# Patient Record
Sex: Female | Born: 1990 | Race: White | Hispanic: No | Marital: Married | Smoking: Never smoker
Health system: Southern US, Community
[De-identification: ages and names within clinical notes are randomized; demographics above are authoritative.]

## PROBLEM LIST (undated history)

## (undated) DIAGNOSIS — Z789 Other specified health status: Secondary | ICD-10-CM

## (undated) HISTORY — PX: NO PAST SURGERIES: SHX2092

## (undated) HISTORY — PX: WISDOM TOOTH EXTRACTION: SHX21

---

## 2016-04-23 NOTE — L&D Delivery Note (Signed)
Delivery Note Pt pushed well about 20 minutes with and at 1:52 AM a healthy female was delivered via Vaginal, Spontaneous Delivery (Presentation: LOA  ).  APGAR: 8, 9; weight pending .   Placenta status: spontaneous .  Cord:  with the following complications: nuchal.    Anesthesia:  Local lidocaine block for repair Episiotomy: None Lacerations: 2nd degree;Perineal Suture Repair: 3.0 vicryl rapide Est. Blood Loss (mL):   Mom to postpartum.  Baby to Couplet care / Skin to Skin.  Marilyn Guerra 01/16/2017, 2:22 AM

## 2016-08-16 LAB — OB RESULTS CONSOLE HIV ANTIBODY (ROUTINE TESTING): HIV: NONREACTIVE

## 2016-08-16 LAB — OB RESULTS CONSOLE RUBELLA ANTIBODY, IGM: Rubella: IMMUNE

## 2016-08-16 LAB — OB RESULTS CONSOLE ABO/RH: RH TYPE: POSITIVE

## 2016-08-16 LAB — OB RESULTS CONSOLE RPR: RPR: NONREACTIVE

## 2016-08-16 LAB — OB RESULTS CONSOLE GC/CHLAMYDIA
Chlamydia: NEGATIVE
GC PROBE AMP, GENITAL: NEGATIVE

## 2016-08-16 LAB — OB RESULTS CONSOLE ANTIBODY SCREEN: ANTIBODY SCREEN: NEGATIVE

## 2016-08-16 LAB — OB RESULTS CONSOLE HEPATITIS B SURFACE ANTIGEN: Hepatitis B Surface Ag: NEGATIVE

## 2017-01-01 LAB — OB RESULTS CONSOLE GBS: STREP GROUP B AG: NEGATIVE

## 2017-01-16 ENCOUNTER — Encounter (HOSPITAL_COMMUNITY): Payer: Self-pay

## 2017-01-16 ENCOUNTER — Inpatient Hospital Stay (HOSPITAL_COMMUNITY)
Admission: AD | Admit: 2017-01-16 | Discharge: 2017-01-17 | DRG: 775 | Disposition: A | Discharge status: Home / Self Care | Payer: Commercial Managed Care - PPO | Source: Ambulatory Visit | Attending: Obstetrics and Gynecology | Admitting: Obstetrics and Gynecology

## 2017-01-16 DIAGNOSIS — Z3A38 38 weeks gestation of pregnancy: Secondary | ICD-10-CM

## 2017-01-16 DIAGNOSIS — O26893 Other specified pregnancy related conditions, third trimester: Secondary | ICD-10-CM | POA: Diagnosis present

## 2017-01-16 HISTORY — DX: Other specified health status: Z78.9

## 2017-01-16 LAB — CBC
HCT: 38.3 % (ref 36.0–46.0)
HCT: 34.6 % — ABNORMAL LOW (ref 36.0–46.0)
HEMOGLOBIN: 12.1 g/dL (ref 12.0–15.0)
Hemoglobin: 13.6 g/dL (ref 12.0–15.0)
MCH: 30.8 pg (ref 26.0–34.0)
MCH: 30.8 pg (ref 26.0–34.0)
MCHC: 35.5 g/dL (ref 30.0–36.0)
MCHC: 35 g/dL (ref 30.0–36.0)
MCV: 86.8 fL (ref 78.0–100.0)
MCV: 88 fL (ref 78.0–100.0)
PLATELETS: 234 10*3/uL (ref 150–400)
Platelets: 250 10*3/uL (ref 150–400)
RBC: 4.41 MIL/uL (ref 3.87–5.11)
RBC: 3.93 MIL/uL (ref 3.87–5.11)
RDW: 13.8 % (ref 11.5–15.5)
RDW: 13.6 % (ref 11.5–15.5)
WBC: 15.5 10*3/uL — ABNORMAL HIGH (ref 4.0–10.5)
WBC: 19.5 10*3/uL — ABNORMAL HIGH (ref 4.0–10.5)

## 2017-01-16 LAB — ABO/RH: ABO/RH(D): A POS

## 2017-01-16 LAB — TYPE AND SCREEN
ABO/RH(D): A POS
Antibody Screen: NEGATIVE

## 2017-01-16 LAB — POCT FERN TEST: POCT FERN TEST: POSITIVE

## 2017-01-16 LAB — RPR: RPR: NONREACTIVE

## 2017-01-16 MED ORDER — PRENATAL MULTIVITAMIN CH
1.0000 | ORAL_TABLET | Freq: Every day | ORAL | Status: DC
  Administered 2017-01-16: 1 via ORAL
  Filled 2017-01-16: qty 1

## 2017-01-16 MED ORDER — SENNOSIDES-DOCUSATE SODIUM 8.6-50 MG PO TABS
2.0000 | ORAL_TABLET | ORAL | Status: DC
  Administered 2017-01-16: 2 via ORAL
  Filled 2017-01-16: qty 2

## 2017-01-16 MED ORDER — ACETAMINOPHEN 325 MG PO TABS: 650.0000 mg | ORAL_TABLET | ORAL | Status: DC | PRN

## 2017-01-16 MED ORDER — DIPHENHYDRAMINE HCL 25 MG PO CAPS: 25.0000 mg | ORAL_CAPSULE | Freq: Four times a day (QID) | ORAL | Status: DC | PRN

## 2017-01-16 MED ORDER — LIDOCAINE HCL (PF) 1 % IJ SOLN
30.0000 mL | INTRAMUSCULAR | Status: DC | PRN
  Administered 2017-01-16: 30 mL via SUBCUTANEOUS
  Filled 2017-01-16: qty 30

## 2017-01-16 MED ORDER — OXYTOCIN BOLUS FROM INFUSION
500.0000 mL | Freq: Once | INTRAVENOUS | Status: AC
  Administered 2017-01-16: 500 mL via INTRAVENOUS

## 2017-01-16 MED ORDER — SIMETHICONE 80 MG PO CHEW: 80.0000 mg | CHEWABLE_TABLET | ORAL | Status: DC | PRN

## 2017-01-16 MED ORDER — LACTATED RINGERS IV SOLN
INTRAVENOUS | Status: DC
  Administered 2017-01-16: 01:00:00 via INTRAVENOUS

## 2017-01-16 MED ORDER — OXYCODONE-ACETAMINOPHEN 5-325 MG PO TABS: 1.0000 | ORAL_TABLET | ORAL | Status: DC | PRN

## 2017-01-16 MED ORDER — ZOLPIDEM TARTRATE 5 MG PO TABS: 5.0000 mg | ORAL_TABLET | Freq: Every evening | ORAL | Status: DC | PRN

## 2017-01-16 MED ORDER — OXYTOCIN 40 UNITS IN LACTATED RINGERS INFUSION - SIMPLE MED
INTRAVENOUS | Status: AC
  Filled 2017-01-16: qty 1000

## 2017-01-16 MED ORDER — LIDOCAINE HCL (PF) 1 % IJ SOLN
INTRAMUSCULAR | Status: AC
  Filled 2017-01-16: qty 30

## 2017-01-16 MED ORDER — OXYCODONE HCL 5 MG PO TABS: 5.0000 mg | ORAL_TABLET | ORAL | Status: DC | PRN

## 2017-01-16 MED ORDER — OXYCODONE-ACETAMINOPHEN 5-325 MG PO TABS: 2.0000 | ORAL_TABLET | ORAL | Status: DC | PRN

## 2017-01-16 MED ORDER — LACTATED RINGERS IV SOLN: 500.0000 mL | INTRAVENOUS | Status: DC | PRN

## 2017-01-16 MED ORDER — DIBUCAINE 1 % RE OINT: 1.0000 "application " | TOPICAL_OINTMENT | RECTAL | Status: DC | PRN

## 2017-01-16 MED ORDER — TETANUS-DIPHTH-ACELL PERTUSSIS 5-2.5-18.5 LF-MCG/0.5 IM SUSP: 0.5000 mL | Freq: Once | INTRAMUSCULAR | Status: DC

## 2017-01-16 MED ORDER — IBUPROFEN 600 MG PO TABS
600.0000 mg | ORAL_TABLET | Freq: Four times a day (QID) | ORAL | Status: DC
  Administered 2017-01-16 – 2017-01-17 (×5): 600 mg via ORAL
  Filled 2017-01-16 (×5): qty 1

## 2017-01-16 MED ORDER — OXYTOCIN 40 UNITS IN LACTATED RINGERS INFUSION - SIMPLE MED: 2.5000 [IU]/h | INTRAVENOUS | Status: DC

## 2017-01-16 MED ORDER — BENZOCAINE-MENTHOL 20-0.5 % EX AERO
1.0000 "application " | INHALATION_SPRAY | CUTANEOUS | Status: DC | PRN
  Administered 2017-01-16: 1 via TOPICAL
  Filled 2017-01-16: qty 56

## 2017-01-16 MED ORDER — OXYCODONE HCL 5 MG PO TABS: 10.0000 mg | ORAL_TABLET | ORAL | Status: DC | PRN

## 2017-01-16 MED ORDER — SOD CITRATE-CITRIC ACID 500-334 MG/5ML PO SOLN: 30.0000 mL | ORAL | Status: DC | PRN

## 2017-01-16 MED ORDER — WITCH HAZEL-GLYCERIN EX PADS: 1.0000 "application " | MEDICATED_PAD | CUTANEOUS | Status: DC | PRN

## 2017-01-16 MED ORDER — ONDANSETRON HCL 4 MG/2ML IJ SOLN: 4.0000 mg | Freq: Four times a day (QID) | INTRAMUSCULAR | Status: DC | PRN

## 2017-01-16 MED ORDER — FLEET ENEMA 7-19 GM/118ML RE ENEM: 1.0000 | ENEMA | RECTAL | Status: DC | PRN

## 2017-01-16 MED ORDER — COCONUT OIL OIL: 1.0000 "application " | TOPICAL_OIL | Status: DC | PRN

## 2017-01-16 MED ORDER — ONDANSETRON HCL 4 MG/2ML IJ SOLN: 4.0000 mg | INTRAMUSCULAR | Status: DC | PRN

## 2017-01-16 MED ORDER — ONDANSETRON HCL 4 MG PO TABS: 4.0000 mg | ORAL_TABLET | ORAL | Status: DC | PRN

## 2017-01-16 NOTE — Plan of Care (Signed)
Problem: Education: Goal: Knowledge of condition will improve Outcome: Progressing Bladder, bleeding, skin to skin, education completed

## 2017-01-16 NOTE — MAU Note (Signed)
Pt states water broke at 1245a-clear fluid. States she is contracting every 5 mins. Pt denies vaginal bleeding. Reports good fetal movement. States cervix was 3cm on last exam.

## 2017-01-16 NOTE — Progress Notes (Signed)
PPD #0 No problems Afeb, VSS Fundus firm, NT at U-1 Continue routine postpartum care 

## 2017-01-16 NOTE — Lactation Note (Signed)
This note was copied from a baby's chart. Lactation Consultation Note  Patient Name: Marilyn Guerra [id number] Date: 01/16/2017 Reason for consult: Initial assessment   Initial consult with mom of 11 hour old infant. Infant with 3 BF for 15-30 minutes, 1 BF attempt, 1 void and 1 stools since birth. Infant weight 6 lb 14.4 oz. LATCH scores 7. Infant currently asleep in crib.   Mom concerned infant wants to sleep a lot today. Discussed normal NB feeding behavior, colostrum, milk coming to volume and NB Nutritional needs. Enc mom to undress infant and place her STS every few hours to encourage feeding. Worked with mom on hand expression. Mom was able to hand express colostrum easily. She was given spoons and colostrum collection containers. We discussed how to spoon feed and storage of EBM at room temp. Enc mom to hand express before and after feeding to promote milk production. Enc mom to feed infant all EBM that is obtained via spoon, especially if infant is sleepy and will not awaken to latch. Advised mom to call out to be shown how to spoon feed when infant wakes up.   Mom with large compressible breasts and areola with short shaft everted nipples. Nipples are very pink, mom reports this is normal for her. LC questions bruising to nipples. Mom reports no pain with feedings.   #Enc mom to feed infant STS 8-12 x in 24 hours at first feeding cues. Enc mom to feed infant STS and to use pillow and head support with feedings.   BF Resources handout and LC Brochure given, mom informed of IP/OP Services, BF Support Groups and LC Phone #. Enc mom to call out for feeding assistance as needed.   Mom has pump at home, she is unsure of what brand.      Maternal Data Formula Feeding for Exclusion: No Has patient been taught Hand Expression?: Yes Does the patient have breastfeeding experience prior to this delivery?: No  Feeding Feeding Type: Breast Fed Length of feed: 2 min  LATCH Score Latch:  Repeated attempts needed to sustain latch, nipple held in mouth throughout feeding, stimulation needed to elicit sucking reflex.  Audible Swallowing: A few with stimulation (1 swallow heard)  Type of Nipple: Everted at rest and after stimulation  Comfort (Breast/Nipple): Soft / non-tender  Hold (Positioning): Assistance needed to correctly position infant at breast and maintain latch.  LATCH Score: 7  Interventions Interventions: Breast feeding basics reviewed;Support pillows;Skin to skin;Expressed milk;Hand express  Lactation Tools Discussed/Used WIC Program: No Pump Review: Milk Storage   Consult Status Consult Status: Follow-up Date: 01/17/17 Follow-up type: In-patient    [name] [name] 01/16/2017, 2:04 PM

## 2017-01-16 NOTE — H&P (Signed)
Marilyn Guerra is a 26 y.o. female G1P0 at 59 1/7 weeks (EDD 01/29/17 by 6 week [place]) presenting in active labor with advanced dilation to 8cm, SROM on arrival.   Prenatal care uneventful.  Transferred to our office from [facility] at 16 weeks.   OB History    Gravida Para Term Preterm AB Living   1             SAB TAB Ectopic Multiple Live Births                 Past Medical History:  Diagnosis Date  . Medical history non-contributory    Past Surgical History:  Procedure Laterality Date  . NO PAST SURGERIES     Family History: family history is not on file. Social History:  reports that she has never smoked. She has never used smokeless tobacco. She reports that she does not drink alcohol or use drugs.     Maternal Diabetes: No Genetic Screening: Normal Maternal Ultrasounds/Referrals: Normal Fetal Ultrasounds or other Referrals:  None Maternal Substance Abuse:  No Significant Maternal Medications:  None Significant Maternal Lab Results:  None Other Comments:  None  Review of Systems  Gastrointestinal: Positive for abdominal pain.   Maternal Medical History:  Reason for admission: Contractions.   Contractions: Onset was 1-2 hours ago.   Frequency: regular.   Perceived severity is strong.    Fetal activity: Perceived fetal activity is normal.    Prenatal Complications - Diabetes: none.    Dilation: 10 Effacement (%): 90 Exam by:: Dr. Senaida Ores Blood pressure 122/71, pulse (!) 108, temperature 97.9 F (36.6 C), temperature source Oral, resp. rate 20. Maternal Exam:  Uterine Assessment: Contraction strength is firm.  Contraction frequency is regular.   Abdomen: Patient reports no abdominal tenderness. Fetal presentation: vertex  Introitus: Normal vulva. Normal vagina.    Physical Exam  Constitutional: She appears well-developed.  Cardiovascular: Normal rate and regular rhythm.   Respiratory: Effort normal.  GI: Soft.  Genitourinary: Vagina normal.   Musculoskeletal: Normal range of motion.  Neurological: She is alert.  Psychiatric: She has a normal mood and affect.    Prenatal labs: ABO, Rh: --/--/A POS (09/26 0130) Antibody: NEG (09/26 0130) Rubella: Immune (04/26 0000) RPR: Nonreactive (04/26 0000)  HBsAg: Negative (04/26 0000)  HIV: Non-reactive (04/26 0000)  GBS: Negative (09/11 0000)  Tetra screen negative One hour GCT 120 CF negative  Assessment/Plan: Pt presented in advanced labor and was complete on arrival to L&D with urge to push, so we began pushing shortly after arrival.  GBS negative  Oliver Pila 01/16/2017, 2:18 AM

## 2017-01-17 MED ORDER — IBUPROFEN 600 MG PO TABS: 600.0000 mg | ORAL_TABLET | Freq: Four times a day (QID) | ORAL | 1 refills | Status: DC | PRN

## 2017-01-17 NOTE — Progress Notes (Signed)
Patient ID: Marilyn Guerra, female   DOB: [birth year], 26 y.o.   MRN: [id number] Pt doing well. Pain controlled. Lochia mild. No CP/SOB/HAs. Bonding well with baby - desires discharge to home today VSS ABD - FF EXT - no homans or edema  19.5>12.1<234  A/P: PPD#1 s/p svd - stable        Discharge instructions reviewed         F/u in 6 weeks

## 2017-01-17 NOTE — Discharge Instructions (Signed)
Nothing in vagina for 6 weeks.  No sex, tampons, and douching.  Other instructions as in [facility] Discharge Booklet. °

## 2017-01-17 NOTE — Discharge Summary (Signed)
OB Discharge Summary     Patient Name: Marilyn Guerra DOB: 09-Nov-1990 MRN: [id number]  Date of admission: 01/16/2017 Delivering MD: Huel Cote   Date of discharge: 01/17/2017  Admitting diagnosis: 38 WEEKS CTX Intrauterine pregnancy: [redacted]w[redacted]d     Secondary diagnosis:  Active Problems:   Indication for care in labor and delivery, antepartum   NSVD (normal spontaneous vaginal delivery)  Additional problems: none     Discharge diagnosis: Term Pregnancy Delivered                                                                                                Post partum procedures:none  Augmentation: none  Complications: None  Hospital course:  Onset of Labor With Vaginal Delivery     26 y.o. yo G1P1001 at [redacted]w[redacted]d was admitted in Active Labor on 01/16/2017. Patient had an uncomplicated labor course as follows: precipitous delivery Membrane Rupture Time/Date: 12:45 AM ,01/16/2017   Intrapartum Procedures: Episiotomy: None [1]                                         Lacerations:  2nd degree [3];Perineal [11]  Patient had a delivery of a Viable infant. 01/16/2017  Information for the patient's newborn:  Zeah, Germano [[id number]]  Delivery Method: Vaginal, Spontaneous Delivery (Filed from Delivery Summary)    Pateint had an uncomplicated postpartum course.  She is ambulating, tolerating a regular diet, passing flatus, and urinating well. Patient is discharged home in stable condition on 01/17/17.   Physical exam  Vitals:   01/16/17 0900 01/16/17 1550 01/16/17 1749 01/17/17 0558  BP: 113/67 121/73 122/71 114/75  Pulse: 98 75 86 67  Resp: Temp: 97.9 F (36.6 C) 98.5 F (36.9 C) 99.5 F (37.5 C) 97.9 F (36.6 C)  TempSrc: Oral Oral Oral Oral  SpO2: 100%     Weight:      Height:       General: alert, cooperative and no distress Lochia: appropriate Uterine Fundus: firm Incision: N/A DVT Evaluation: No evidence of DVT seen on physical exam. Labs: Lab  Results  Component Value Date   WBC 19.5 (H) 01/16/2017   HGB 12.1 01/16/2017   HCT 34.6 (L) 01/16/2017   MCV 88.0 01/16/2017   PLT 234 01/16/2017   No flowsheet data found.  Discharge instruction: per After Visit Summary and "Baby and Me Booklet".  After visit meds:  Allergies as of 01/17/2017      Reactions   Cephalosporins Hives   Sulfa Antibiotics Hives      Medication List    TAKE these medications   acetaminophen 500 MG tablet Commonly known as:  TYLENOL Take 500 mg by mouth every 6 (six) hours as needed for mild pain or headache.   calcium carbonate 500 MG chewable tablet Commonly known as:  TUMS - dosed in mg elemental calcium Chew 1 tablet by mouth 2 (two) times daily as needed for indigestion or heartburn.   ibuprofen 600 MG tablet  Commonly known as:  ADVIL,MOTRIN Take 1 tablet (600 mg total) by mouth every 6 (six) hours as needed for moderate pain or cramping.   polyethylene glycol packet Commonly known as:  MIRALAX / GLYCOLAX Take 17 g by mouth daily as needed.   prenatal multivitamin Tabs tablet Take 1 tablet by mouth daily at 12 noon.            Discharge Care Instructions        Start     Ordered   01/17/17 0000  ibuprofen (ADVIL,MOTRIN) 600 MG tablet  Every 6 hours PRN     01/17/17 0955   01/17/17 0000  Diet - low sodium heart healthy     01/17/17 0955   01/17/17 0000  Discharge instructions    Comments:  Nothing in vagina for 6 weeks.  No sex, tampons, and douching.  Other instructions as in [employer].   01/17/17 0955   01/17/17 0000  Call MD for:  persistant nausea and vomiting     01/17/17 0955   01/17/17 0000  Call MD for:  temperature >100.4     01/17/17 0955   01/17/17 0000  Call MD for:  severe uncontrolled pain     01/17/17 0955   01/17/17 0000  Call MD for:  persistant dizziness or light-headedness     01/17/17 0955   01/17/17 0000  Call MD for:  extreme fatigue     01/17/17 0955   01/17/17 0000   Activity as tolerated     01/17/17 0955   01/17/17 0000  Lifting restrictions    Comments:  Weight restriction of 15 lbs.   01/17/17 0955   01/17/17 0000  Driving restriction     Comments:  Avoid driving for at least 1-2 weeks.   01/17/17 0955   01/17/17 0000  Sexual acrtivity    Comments:  Nothing in vagina for 6 weeks.  No sex, tampons, and douching.  Other instructions as in [employer].   01/17/17 0955   01/16/17 0000  OB RESULT CONSOLE Group B Strep    Comments:  This external order was created through the Results Console.   01/16/17 0050   01/16/17 0000  OB RESULTS CONSOLE GC/Chlamydia    Comments:  This external order was created through the Results Console.   01/16/17 0112   01/16/17 0000  OB RESULTS CONSOLE RPR    Comments:  This external order was created through the Results Console.    01/16/17 0112   01/16/17 0000  OB RESULTS CONSOLE HIV antibody    Comments:  This external order was created through the Results Console.    01/16/17 0112   01/16/17 0000  OB RESULTS CONSOLE Rubella Antibody    Comments:  This external order was created through the Results Console.    01/16/17 0112   01/16/17 0000  OB RESULTS CONSOLE Hepatitis B surface antigen    Comments:  This external order was created through the Results Console.    01/16/17 0112   01/16/17 0000  OB RESULTS CONSOLE ABO/Rh    Comments:  This external order was created through the Results Console.    01/16/17 0112   01/16/17 0000  OB RESULTS CONSOLE Antibody Screen    Comments:  This external order was created through the Results Console.    01/16/17 0112      Diet: routine diet  Activity: Advance as tolerated. Pelvic rest for 6 weeks.   Outpatient follow  up:6 weeks Follow up Appt:No future appointments. Follow up Visit:No Follow-up on file.  Postpartum contraception: Not Discussed  Newborn Data: Live born female  Birth Weight: 6 lb 14.4 oz (3130 g) APGAR: 8, 9  Baby  Feeding: Breast Disposition:home with mother   01/17/2017 Cathrine Muster, DO

## 2017-04-25 LAB — OB RESULTS CONSOLE GBS: GBS: POSITIVE

## 2017-10-11 LAB — OB RESULTS CONSOLE GC/CHLAMYDIA
Chlamydia: NEGATIVE
Gonorrhea: NEGATIVE

## 2017-10-11 LAB — OB RESULTS CONSOLE HEPATITIS B SURFACE ANTIGEN: HEP B S AG: NEGATIVE

## 2017-10-11 LAB — OB RESULTS CONSOLE ABO/RH: RH TYPE: POSITIVE

## 2017-10-11 LAB — OB RESULTS CONSOLE RUBELLA ANTIBODY, IGM: Rubella: IMMUNE

## 2017-10-11 LAB — OB RESULTS CONSOLE RPR: RPR: NONREACTIVE

## 2017-10-11 LAB — OB RESULTS CONSOLE HIV ANTIBODY (ROUTINE TESTING): HIV: NONREACTIVE

## 2017-10-11 LAB — OB RESULTS CONSOLE ANTIBODY SCREEN: ANTIBODY SCREEN: NEGATIVE

## 2018-04-23 NOTE — L&D Delivery Note (Signed)
Delivery Note Pt with severe pressure, anterior lip reduced.  With one contractions.  At 10:16 PM a viable and healthy female was delivered via Vaginal, Spontaneous (Presentation: OA; LOT ).  APGAR: 8, 9; weight P .   Placenta status: delivered, intact.  Cord:3V  with the following complications: none.    Anesthesia:  none Episiotomy: None Lacerations: 2nd degree Suture Repair: 3.0 vicryl rapide Est. Blood Loss (mL): 86  Mom to postpartum.  Baby to Couplet care / Skin to Skin.  Jaydeen Darley Bovard-Stuckert 05/18/2018, 10:42 PM  Br/A+/RI/no Tdap in PNC/contra?

## 2018-05-15 ENCOUNTER — Encounter (HOSPITAL_COMMUNITY): Payer: Self-pay | Admitting: *Deleted

## 2018-05-15 ENCOUNTER — Telehealth (HOSPITAL_COMMUNITY): Payer: Self-pay | Admitting: *Deleted

## 2018-05-15 LAB — OB RESULTS CONSOLE GBS: STREP GROUP B AG: POSITIVE

## 2018-05-15 NOTE — Telephone Encounter (Signed)
Preadmission screen  

## 2018-05-18 ENCOUNTER — Inpatient Hospital Stay (HOSPITAL_COMMUNITY)
Admission: AD | Admit: 2018-05-18 | Discharge: 2018-05-20 | DRG: 807 | Disposition: A | Discharge status: Home / Self Care | Payer: Commercial Managed Care - PPO | Attending: Obstetrics and Gynecology | Admitting: Obstetrics and Gynecology

## 2018-05-18 ENCOUNTER — Other Ambulatory Visit: Payer: Self-pay

## 2018-05-18 ENCOUNTER — Encounter (HOSPITAL_COMMUNITY): Payer: Self-pay

## 2018-05-18 ENCOUNTER — Inpatient Hospital Stay (HOSPITAL_BASED_OUTPATIENT_CLINIC_OR_DEPARTMENT_OTHER): Payer: Commercial Managed Care - PPO

## 2018-05-18 DIAGNOSIS — O368130 Decreased fetal movements, third trimester, not applicable or unspecified: Secondary | ICD-10-CM | POA: Diagnosis present

## 2018-05-18 DIAGNOSIS — Z3A39 39 weeks gestation of pregnancy: Secondary | ICD-10-CM | POA: Diagnosis not present

## 2018-05-18 DIAGNOSIS — O99824 Streptococcus B carrier state complicating childbirth: Secondary | ICD-10-CM | POA: Diagnosis present

## 2018-05-18 DIAGNOSIS — O368190 Decreased fetal movements, unspecified trimester, not applicable or unspecified: Secondary | ICD-10-CM

## 2018-05-18 LAB — TYPE AND SCREEN
ABO/RH(D): A POS
Antibody Screen: NEGATIVE

## 2018-05-18 LAB — CBC
HCT: 35.8 % — ABNORMAL LOW (ref 36.0–46.0)
Hemoglobin: 11.7 g/dL — ABNORMAL LOW (ref 12.0–15.0)
MCH: 28.8 pg (ref 26.0–34.0)
MCHC: 32.7 g/dL (ref 30.0–36.0)
MCV: 88.2 fL (ref 80.0–100.0)
Platelets: 288 10*3/uL (ref 150–400)
RBC: 4.06 MIL/uL (ref 3.87–5.11)
RDW: 14.6 % (ref 11.5–15.5)
WBC: 10 10*3/uL (ref 4.0–10.5)
nRBC: 0 % (ref 0.0–0.2)

## 2018-05-18 MED ORDER — OXYTOCIN 40 UNITS IN NORMAL SALINE INFUSION - SIMPLE MED
2.5000 [IU]/h | INTRAVENOUS | Status: DC
  Administered 2018-05-18: 2.5 [IU]/h via INTRAVENOUS
  Filled 2018-05-18: qty 1000

## 2018-05-18 MED ORDER — OXYCODONE-ACETAMINOPHEN 5-325 MG PO TABS: 1.0000 | ORAL_TABLET | ORAL | Status: DC | PRN

## 2018-05-18 MED ORDER — LACTATED RINGERS IV SOLN: 500.0000 mL | INTRAVENOUS | Status: DC | PRN

## 2018-05-18 MED ORDER — PENICILLIN G 3 MILLION UNITS IVPB - SIMPLE MED
3.0000 10*6.[IU] | INTRAVENOUS | Status: DC
  Administered 2018-05-18: 3 10*6.[IU] via INTRAVENOUS
  Filled 2018-05-18 (×4): qty 100

## 2018-05-18 MED ORDER — SODIUM CHLORIDE 0.9 % IV SOLN
5.0000 10*6.[IU] | Freq: Once | INTRAVENOUS | Status: AC
  Administered 2018-05-18: 5 10*6.[IU] via INTRAVENOUS
  Filled 2018-05-18: qty 5

## 2018-05-18 MED ORDER — LIDOCAINE HCL (PF) 1 % IJ SOLN: 30.0000 mL | INTRAMUSCULAR | Status: DC | PRN

## 2018-05-18 MED ORDER — PENICILLIN G 3 MILLION UNITS IVPB - SIMPLE MED: 3.0000 10*6.[IU] | INTRAVENOUS | Status: DC

## 2018-05-18 MED ORDER — ACETAMINOPHEN 325 MG PO TABS: 650.0000 mg | ORAL_TABLET | ORAL | Status: DC | PRN

## 2018-05-18 MED ORDER — ONDANSETRON HCL 4 MG/2ML IJ SOLN: 4.0000 mg | Freq: Four times a day (QID) | INTRAMUSCULAR | Status: DC | PRN

## 2018-05-18 MED ORDER — SOD CITRATE-CITRIC ACID 500-334 MG/5ML PO SOLN: 30.0000 mL | ORAL | Status: DC | PRN

## 2018-05-18 MED ORDER — SODIUM CHLORIDE 0.9 % IV SOLN: 5.0000 10*6.[IU] | Freq: Once | INTRAVENOUS | Status: DC

## 2018-05-18 MED ORDER — OXYTOCIN 40 UNITS IN NORMAL SALINE INFUSION - SIMPLE MED: 1.0000 m[IU]/min | INTRAVENOUS | Status: DC

## 2018-05-18 MED ORDER — OXYCODONE-ACETAMINOPHEN 5-325 MG PO TABS: 2.0000 | ORAL_TABLET | ORAL | Status: DC | PRN

## 2018-05-18 MED ORDER — TERBUTALINE SULFATE 1 MG/ML IJ SOLN: 0.2500 mg | Freq: Once | INTRAMUSCULAR | Status: DC | PRN

## 2018-05-18 MED ORDER — OXYTOCIN BOLUS FROM INFUSION: 500.0000 mL | Freq: Once | INTRAVENOUS | Status: DC

## 2018-05-18 MED ORDER — LACTATED RINGERS IV SOLN
500.0000 mL | INTRAVENOUS | Status: DC | PRN
  Administered 2018-05-18: 500 mL via INTRAVENOUS

## 2018-05-18 MED ORDER — OXYTOCIN BOLUS FROM INFUSION
500.0000 mL | Freq: Once | INTRAVENOUS | Status: AC
  Administered 2018-05-18: 500 mL via INTRAVENOUS

## 2018-05-18 MED ORDER — BUTORPHANOL TARTRATE 1 MG/ML IJ SOLN: 1.0000 mg | INTRAMUSCULAR | Status: DC | PRN

## 2018-05-18 MED ORDER — LIDOCAINE HCL (PF) 1 % IJ SOLN
30.0000 mL | INTRAMUSCULAR | Status: DC | PRN
  Filled 2018-05-18: qty 30

## 2018-05-18 MED ORDER — LACTATED RINGERS IV SOLN
INTRAVENOUS | Status: DC
  Administered 2018-05-18 (×2): via INTRAVENOUS

## 2018-05-18 MED ORDER — LACTATED RINGERS IV SOLN: INTRAVENOUS | Status: DC

## 2018-05-18 MED ORDER — OXYTOCIN 40 UNITS IN NORMAL SALINE INFUSION - SIMPLE MED: 2.5000 [IU]/h | INTRAVENOUS | Status: DC

## 2018-05-18 NOTE — H&P (Signed)
Marilyn Guerra is a 28 y.o. female G2P1001 at 39+ who called w decreased fetal movement, sent to MAU for evaluation.  Had reassuring tracing, 6/8 BPP, the 6 min decel.  Admitted for IOL/augmentation.  Having contractions.m  Relatively uncomplicated PNC.  H/o fast labor.    OB History    Gravida  2   Para  1   Term  1   Preterm      AB      Living  1     SAB      TAB      Ectopic      Multiple  0   Live Births  1         no abn pap No STD G1 38wk SVD female 6#14 G2 present   Past Medical History:  Diagnosis Date  . Indication for care in labor or delivery 05/18/2018  . Medical history non-contributory    Past Surgical History:  Procedure Laterality Date  . NO PAST SURGERIES    . WISDOM TOOTH EXTRACTION     Family History: Br CA, DM, ov CA, HTN, Colon CA, prostate CA Social History:  reports that she has never smoked. She has never used smokeless tobacco. She reports that she does not drink alcohol or use drugs. SAHM, married  Meds PNV All cephalosporin and sulfa    Maternal Diabetes: No Genetic Screening: Declined Maternal Ultrasounds/Referrals: Normal Fetal Ultrasounds or other Referrals:  None Maternal Substance Abuse:  No Significant Maternal Medications:  None Significant Maternal Lab Results:  Lab values include: Group B Strep positive Other Comments:  None  Review of Systems  Constitutional: Negative.   HENT: Negative.   Eyes: Negative.   Respiratory: Negative.   Cardiovascular: Negative.   Gastrointestinal: Negative.   Genitourinary: Negative.   Musculoskeletal: Negative.   Skin: Negative.   Neurological: Negative.   Psychiatric/Behavioral: Negative.    Maternal Medical History:  Reason for admission: Contractions.   Contractions: Onset was 6-12 hours ago.    Fetal activity: Perceived fetal activity is normal.    Prenatal Complications - Diabetes: none.    Dilation: 4 Effacement (%): 70 Station: -1 Exam by:: Verdie Drown,  RN Blood pressure 118/69, pulse 89, temperature 98.4 F (36.9 C), temperature source Oral, resp. rate 18, height 5\' 4"  (1.626 m), weight 100.1 kg, last menstrual period 08/06/2017, unknown if currently breastfeeding. Maternal Exam:  Uterine Assessment: Contraction frequency is regular.   Abdomen: Patient reports no abdominal tenderness. Fundal height is app for gestation.   Estimated fetal weight is 7#.   Fetal presentation: vertex  Introitus: Normal vulva. Normal vagina.    Physical Exam  Constitutional: She is oriented to person, place, and time. She appears well-developed and well-nourished.  HENT:  Head: Normocephalic and atraumatic.  Cardiovascular: Normal rate and regular rhythm.  Respiratory: Effort normal and breath sounds normal. No respiratory distress. She has no wheezes.  GI: Soft. Bowel sounds are normal. She exhibits no distension. There is no abdominal tenderness.  Genitourinary:    Vulva normal.   Musculoskeletal: Normal range of motion.  Neurological: She is alert and oriented to person, place, and time.  Skin: Skin is warm and dry.  Psychiatric: She has a normal mood and affect. Her behavior is normal.    Prenatal labs: ABO, Rh: A/Positive/-- (06/21 0000) Antibody: Negative (06/21 0000) Rubella: Immune (06/21 0000) RPR: Nonreactive (06/21 0000)  HBsAg: Negative (06/21 0000)  HIV: Non-reactive (06/21 0000)  GBS: Positive (01/23 0000)  Hgb 13.2/Plt  316/Ur Cx neg/GC neg/Chl neg/Varicella immune/glucola 91  Nl anat, ant plac, female Assessment/Plan: 27yo G2P1001 at 39+ with deceleration of fetal HR in MAU, admitted for augmentation of labor  GBBS positive - PCN for prophylaxis Augment w AROM 4h after PCN Poss augment w pitocin Epidural prn   Marilyn Guerra 05/18/2018, 7:02 PM

## 2018-05-18 NOTE — MAU Note (Signed)
Marilyn Guerra is a 28 y.o. at [redacted]w[redacted]d here in MAU reporting: decreased fetal movement. States she has not felt any movement since 0900. Having some occ cramping/pressure, no bleeding or LOF  Pain score: 2/10 Vitals:   05/18/18 1453  BP: 130/76  Pulse: 97  Resp: 18  Temp: 98 F (36.7 C)     FHT:128  Lab orders placed from triage: none

## 2018-05-18 NOTE — MAU Provider Note (Addendum)
History     CSN: [id number]  Arrival date and time: 05/18/18 1443   First Provider Initiated Contact with Patient 05/18/18 1515     G2P1001 @39 .2 wks presenting with no FM since 9am. Reports eating and drinking w/o problem. No VB or LOF. Having occasioanal ctx and pelvic pressure.    OB History    Gravida  2   Para  1   Term  1   Preterm      AB      Living  1     SAB      TAB      Ectopic      Multiple  0   Live Births  1           Past Medical History:  Diagnosis Date  . Medical history non-contributory     Past Surgical History:  Procedure Laterality Date  . NO PAST SURGERIES    . WISDOM TOOTH EXTRACTION      No family history on file.  Social History   Tobacco Use  . Smoking status: Never Smoker  . Smokeless tobacco: Never Used  Substance Use Topics  . Alcohol use: No  . Drug use: No    Allergies:  Allergies  Allergen Reactions  . Cephalosporins Hives  . Sulfa Antibiotics Hives    Medications Prior to Admission  Medication Sig Dispense Refill Last Dose  . acetaminophen (TYLENOL) 500 MG tablet Take 500 mg by mouth every 6 (six) hours as needed for mild pain or headache.   Past Week at Unknown time  . calcium carbonate (TUMS - DOSED IN MG ELEMENTAL CALCIUM) 500 MG chewable tablet Chew 1 tablet by mouth 2 (two) times daily as needed for indigestion or heartburn.   01/15/2017 at Unknown time  . ibuprofen (ADVIL,MOTRIN) 600 MG tablet Take 1 tablet (600 mg total) by mouth every 6 (six) hours as needed for moderate pain or cramping. 30 tablet 1   . polyethylene glycol (MIRALAX / GLYCOLAX) packet Take 17 g by mouth daily as needed.   Past Week at Unknown time  . Prenatal Vit-Fe Fumarate-FA (PRENATAL MULTIVITAMIN) TABS tablet Take 1 tablet by mouth daily at 12 noon.   01/15/2017 at Unknown time    Review of Systems  Gastrointestinal: Negative for abdominal pain.  Genitourinary: Negative for vaginal bleeding.   Physical Exam   Blood  pressure 130/76, pulse 97, temperature 98 F (36.7 C), temperature source Oral, resp. rate 18, height 5\' 4"  (1.626 m), weight 100.1 kg, last menstrual period 08/06/2017, unknown if currently breastfeeding.  Physical Exam  Constitutional: She is oriented to person, place, and time. She appears well-developed and well-nourished. No distress.  HENT:  Head: Normocephalic and atraumatic.  Neck: Normal range of motion.  Respiratory: Effort normal. No respiratory distress.  GI: Soft. She exhibits no distension. There is no abdominal tenderness.  gravid  Genitourinary:    Genitourinary Comments: SVE 4/60/-2, vtx   Musculoskeletal: Normal range of motion.  Neurological: She is alert and oriented to person, place, and time.  Skin: Skin is warm and dry.  Psychiatric: She has a normal mood and affect.  EFM: 120 bpm, mod variability, + accels, early decel x1 Toco: q7  No results found for this or any previous visit (from the past 24 hour(s)).  MAU Course  Procedures Orders Placed This Encounter  Procedures  . US MFM FETAL BPP WO NON STRESS    Standing Status:   Standing    Number  of Occurrences:   1    Order Specific Question:   What location should the exam be performed?    Answer:   [facility]-MFM ULTRASOUND    Order Specific Question:   Symptom/Reason for Exam    Answer:   Decreased fetal movement [161096]   MDM Chart review: pregnancy complicated by short pregnancy interval and +GBS. Feeling FM since EFM applied. BPP 6/8, off for breathing, normal AFI. Called to bedside shortly after return from [place] for FHR decel into 90s x5-6 min, FHR recovered after maternal repositioning, IVF, and o2. Recommend admit and IOL, discussed with Dr. Ellyn Hack, agrees. Admit orders entered.  Assessment and Plan   1. [redacted] weeks gestation of pregnancy   2. Decreased fetal movement    Admit to [facility] per Dr. Wyvonna Plum, CNM 05/18/2018, 4:28 PM

## 2018-05-19 ENCOUNTER — Encounter (HOSPITAL_COMMUNITY): Payer: Self-pay

## 2018-05-19 LAB — CBC
HCT: 34.1 % — ABNORMAL LOW (ref 36.0–46.0)
HEMOGLOBIN: 11.1 g/dL — AB (ref 12.0–15.0)
MCH: 28.5 pg (ref 26.0–34.0)
MCHC: 32.6 g/dL (ref 30.0–36.0)
MCV: 87.4 fL (ref 80.0–100.0)
Platelets: 269 10*3/uL (ref 150–400)
RBC: 3.9 MIL/uL (ref 3.87–5.11)
RDW: 14.7 % (ref 11.5–15.5)
WBC: 15.9 10*3/uL — ABNORMAL HIGH (ref 4.0–10.5)
nRBC: 0 % (ref 0.0–0.2)

## 2018-05-19 LAB — RPR: RPR: NONREACTIVE

## 2018-05-19 MED ORDER — SIMETHICONE 80 MG PO CHEW: 80.0000 mg | CHEWABLE_TABLET | ORAL | Status: DC | PRN

## 2018-05-19 MED ORDER — DIBUCAINE 1 % RE OINT: 1.0000 "application " | TOPICAL_OINTMENT | RECTAL | Status: DC | PRN

## 2018-05-19 MED ORDER — SENNOSIDES-DOCUSATE SODIUM 8.6-50 MG PO TABS
2.0000 | ORAL_TABLET | ORAL | Status: DC
  Administered 2018-05-19 (×2): 2 via ORAL
  Filled 2018-05-19 (×2): qty 2

## 2018-05-19 MED ORDER — FAMOTIDINE 20 MG PO TABS: 10.0000 mg | ORAL_TABLET | Freq: Every day | ORAL | Status: DC | PRN

## 2018-05-19 MED ORDER — ONDANSETRON HCL 4 MG/2ML IJ SOLN: 4.0000 mg | INTRAMUSCULAR | Status: DC | PRN

## 2018-05-19 MED ORDER — IBUPROFEN 600 MG PO TABS
600.0000 mg | ORAL_TABLET | Freq: Four times a day (QID) | ORAL | Status: DC
  Administered 2018-05-19 – 2018-05-20 (×7): 600 mg via ORAL
  Filled 2018-05-19 (×7): qty 1

## 2018-05-19 MED ORDER — COCONUT OIL OIL
1.0000 "application " | TOPICAL_OIL | Status: DC | PRN
  Administered 2018-05-20: 1 via TOPICAL
  Filled 2018-05-19: qty 120

## 2018-05-19 MED ORDER — ZOLPIDEM TARTRATE 5 MG PO TABS: 5.0000 mg | ORAL_TABLET | Freq: Every evening | ORAL | Status: DC | PRN

## 2018-05-19 MED ORDER — OXYCODONE HCL 5 MG PO TABS: 10.0000 mg | ORAL_TABLET | ORAL | Status: DC | PRN

## 2018-05-19 MED ORDER — OXYCODONE HCL 5 MG PO TABS: 5.0000 mg | ORAL_TABLET | ORAL | Status: DC | PRN

## 2018-05-19 MED ORDER — WITCH HAZEL-GLYCERIN EX PADS: 1.0000 "application " | MEDICATED_PAD | CUTANEOUS | Status: DC | PRN

## 2018-05-19 MED ORDER — DIPHENHYDRAMINE HCL 25 MG PO CAPS: 25.0000 mg | ORAL_CAPSULE | Freq: Four times a day (QID) | ORAL | Status: DC | PRN

## 2018-05-19 MED ORDER — PRENATAL MULTIVITAMIN CH
1.0000 | ORAL_TABLET | Freq: Every day | ORAL | Status: DC
  Administered 2018-05-19 – 2018-05-20 (×2): 1 via ORAL
  Filled 2018-05-19 (×2): qty 1

## 2018-05-19 MED ORDER — ONDANSETRON HCL 4 MG PO TABS: 4.0000 mg | ORAL_TABLET | ORAL | Status: DC | PRN

## 2018-05-19 MED ORDER — ACETAMINOPHEN 325 MG PO TABS: 650.0000 mg | ORAL_TABLET | ORAL | Status: DC | PRN

## 2018-05-19 MED ORDER — CALCIUM CARBONATE ANTACID 500 MG PO CHEW: 1.0000 | CHEWABLE_TABLET | Freq: Two times a day (BID) | ORAL | Status: DC | PRN

## 2018-05-19 MED ORDER — BENZOCAINE-MENTHOL 20-0.5 % EX AERO
1.0000 "application " | INHALATION_SPRAY | CUTANEOUS | Status: DC | PRN
  Administered 2018-05-19: 1 via TOPICAL
  Filled 2018-05-19: qty 56

## 2018-05-19 MED ORDER — TETANUS-DIPHTH-ACELL PERTUSSIS 5-2.5-18.5 LF-MCG/0.5 IM SUSP: 0.5000 mL | Freq: Once | INTRAMUSCULAR | Status: DC

## 2018-05-19 NOTE — Progress Notes (Signed)
Patient ID: Marilyn Guerra, female   DOB: 1991-03-03, 28 y.o.   MRN: [id number] PPD#1  Pt doing well with no complaints except fatigue. No fever, chills, CP, SOB or HA. Lochia mild. Ambulating and voiding well. Bonding well with baby - breastfeeding VSS ABD - FF and 2cm below umbilicus EXT - no homans  15.9>11.1<269  A/P: PPD#1 s/p svd - stable          Routine pp care         Likely discharge to home tomorrow

## 2018-05-19 NOTE — Lactation Note (Signed)
This note was copied from a baby's chart. Lactation Consultation Note  Patient Name: [name] ZOXWR'UToday's Date: 05/19/2018 Reason for consult: Initial assessment;Term P2, 6 hour female infant. Per parents infant had one stool at delivery. Per parents, infant breastfeed less than 2 hours prior to  [facility].  LC did not observe a latch at this time. Per mom, she breastfeed her daughter for 3 months and plans to breastfeed infant longer. Per mom, she feels infant is latching well and she doesn't have any breastfeeding concerns. Mom knows to breastfeed according hunger cues and not to exceed 3 hours without breastfeeding infant. LC discussed I & O. Reviewed Baby & Me book's Breastfeeding Basics.  Mom made aware of O/P services, breastfeeding support groups, community resources, and our phone # for post-discharge questions.   Maternal Data Formula Feeding for Exclusion: No Has patient been taught Hand Expression?: Yes Does the patient have breastfeeding experience prior to this delivery?: Yes  Feeding Feeding Type: Breast Fed  LATCH Score Latch: Grasps breast easily, tongue down, lips flanged, rhythmical sucking.  Audible Swallowing: A few with stimulation  Type of Nipple: Everted at rest and after stimulation  Comfort (Breast/Nipple): Soft / non-tender  Hold (Positioning): Assistance needed to correctly position infant at breast and maintain latch.  LATCH Score: 8  Interventions Interventions: Breast feeding basics reviewed  Lactation Tools Discussed/Used WIC Program: No   Consult Status Consult Status: Follow-up Date: 05/20/18 Follow-up type: In-patient    Marilyn Guerra 05/19/2018, 4:48 AM

## 2018-05-19 NOTE — Lactation Note (Signed)
This note was copied from a baby's chart. Lactation Consultation Note  Patient Name: Marilyn Guerra Date: 05/19/2018 Reason for consult: Follow-up assessment;Difficult latch;Term  P2 mother whose infant is now 71 hours old.  Mother requested latch assistance.  Mother was holding baby when I arrived.  Baby was awake but very drowsy and not showing feeding cues.  Demonstrated techniques on how to awaken a sleepy baby.  Baby began to arouse slightly and I assisted mother to latch in the football hold on the right breast.  With much stimulation she began to suck and needed constant stimulation to continue sucking.  Baby really was not interested but mother wanted to continue to try.  I stayed and observed for 5 minutes and reassured mother that, if baby falls asleep, place her STS and watch for cues.  I verbalized that this was typical behavior for a baby at this age.  Mother verbalized understanding and was appreciative of help given.  She will call as needed for latch assistance as baby begins to awaken more and shows feeding cues.   Maternal Data Formula Feeding for Exclusion: No Has patient been taught Hand Expression?: Yes Does the patient have breastfeeding experience prior to this delivery?: Yes  Feeding Feeding Type: Breast Fed  LATCH Score Latch: Grasps breast easily, tongue down, lips flanged, rhythmical sucking.  Audible Swallowing: None  Type of Nipple: Everted at rest and after stimulation  Comfort (Breast/Nipple): Soft / non-tender  Hold (Positioning): Assistance needed to correctly position infant at breast and maintain latch.  LATCH Score: 7  Interventions Interventions: Breast feeding basics reviewed;Assisted with latch;Skin to skin;Breast massage;Position options;Support pillows;Adjust position  Lactation Tools Discussed/Used WIC Program: No   Consult Status Consult Status: Follow-up Date: 05/20/18 Follow-up type: In-patient    Dora Sims 05/19/2018, 4:56 PM

## 2018-05-19 NOTE — Lactation Note (Signed)
This note was copied from a baby's chart. Lactation Consultation Note  Patient Name: Marilyn Guerra [id number]'[id number] Date: 05/19/2018 Reason for consult: Follow-up assessment;Term  P2 mother whose infant is now 62 hours old.  Mother breast fed her first child (now 83 months old)  for 3 months   Baby was getting a bath when I arrived.  After her bath she was placed STS on mother's chest and fell asleep.  Mother had no questions related to breast feeding but stated that she thinks baby has a "lip tie."  She has initial sensitivity with latching which eases with a deep latch.  Her first child had a "lip tie" but baby was able to breast feed without difficulty.  Encouraged to continue feeding 8-12 times/24 hours or sooner if baby shows feeding cues.  Mother is familiar with cues and hand expression.  Suggested to call for latch assistance at the next feeding if desired.  Mother verbalized understanding.  Father present.     Maternal Data Formula Feeding for Exclusion: No Has patient been taught Hand Expression?: Yes Does the patient have breastfeeding experience prior to this delivery?: Yes  Feeding    LATCH Score                   Interventions    Lactation Tools Discussed/Used WIC Program: No   Consult Status Consult Status: Follow-up Date: 05/20/18 Follow-up type: In-patient    Zebastian Carico R Rianne Degraaf 05/19/2018, 1:29 PM

## 2018-05-19 NOTE — Progress Notes (Signed)
Patient ID: Marilyn Guerra, female   DOB: [birth year], 28 y.o.   MRN: [id number] Pt with no complaints  Continue routine care

## 2018-05-20 MED ORDER — IBUPROFEN 600 MG PO TABS: 600.0000 mg | ORAL_TABLET | Freq: Four times a day (QID) | ORAL | 0 refills | Status: DC

## 2018-05-20 NOTE — Progress Notes (Signed)
Post Partum Day 2 Subjective: no complaints, up ad lib and tolerating PO  Objective: Blood pressure 120/75, pulse 80, temperature 98.1 F (36.7 C), temperature source Oral, resp. rate 16, height 5\' 4"  (1.626 m), weight 100.1 kg, last menstrual period 08/06/2017, SpO2 97 %, unknown if currently breastfeeding.  Physical Exam:  General: alert and cooperative Lochia: appropriate Uterine Fundus: firm  Recent Labs    05/18/18 1835 05/19/18 0551  HGB 11.7* 11.1*  HCT 35.8* 34.1*    Assessment/Plan: Discharge home   LOS: 2 days   Oliver Pila 05/20/2018, 9:18 AM

## 2018-05-20 NOTE — Lactation Note (Signed)
This note was copied from a baby's chart. Lactation Consultation Note  Patient Name: Marilyn Guerra ZOXWR'UToday's Date: 05/20/2018 Reason for consult: Follow-up assessment;Term Baby is 6734 hours old and at a 5% weight loss.  Mom reports that baby cluster fed last night but feedings are going well.  Discussed milk coming to volume and the prevention and treatment of engorgement.  She has a DEBP for home use.  Instructed to continue feeding with cues.  Growth spurts discussed.  Lactation outpatient services and support reviewed and encouraged prn.  Maternal Data    Feeding    LATCH Score                   Interventions    Lactation Tools Discussed/Used     Consult Status Consult Status: Complete Follow-up type: Call as needed    Huston FoleyMOULDEN, Yesli Vanderhoff S 05/20/2018, 9:00 AM

## 2018-05-20 NOTE — Discharge Summary (Signed)
OB Discharge Summary     Patient Name: Marilyn Guerra DOB: [birth year] MRN: [id number]  Date of admission: 05/18/2018 Delivering MD: Sherian Rein   Date of discharge: 05/20/2018  Admitting diagnosis: 39wks, DFM Intrauterine pregnancy: [redacted]w[redacted]d     Secondary diagnosis:  Principal Problem:   SVD (spontaneous vaginal delivery) Active Problems:   Indication for care in labor or delivery  Additional problems: none    Discharge diagnosis: Term Pregnancy Delivered                                                                                                Post partum procedures:none  Augmentation: AROM and Pitocin  Complications: None  Hospital course:  Onset of Labor With Vaginal Delivery     28 y.o. yo [id number] at [redacted]w[redacted]d was admitted in Latent Labor with a spontaneous deceleration for augmentation on 05/18/2018.   She labored very quickly. Patient had an uncomplicated labor course as follows:  Membrane Rupture Time/Date: 9:58 PM ,05/18/2018   Intrapartum Procedures: Episiotomy: None [1]                                         Lacerations:  2nd degree [3]  Patient had a delivery of a Viable infant. 05/18/2018  Information for the patient's newborn:  Frona, Fennell [[id number]]  Delivery Method: Vag-Spont    Pateint had an uncomplicated postpartum course.  She is ambulating, tolerating a regular diet, passing flatus, and urinating well. Patient is discharged home in stable condition on 05/20/18.   Physical exam  Vitals:   05/19/18 0917 05/19/18 1359 05/19/18 2247 05/20/18 0445  BP: 131/78 (!) 146/81 125/88 120/75  Pulse: 79 92 74 80  Resp: 18 18 17 16   Temp: 98 F (36.7 C) 99.6 F (37.6 C) 97.6 F (36.4 C) 98.1 F (36.7 C)  TempSrc: Oral Oral Oral Oral  SpO2: 99% 97% 95% 97%  Weight:      Height:       General: alert and cooperative Lochia: appropriate Uterine Fundus: firm  Labs: Lab Results  Component Value Date   WBC 15.9 (H) 05/19/2018   HGB 11.1 (L)  05/19/2018   HCT 34.1 (L) 05/19/2018   MCV 87.4 05/19/2018   PLT 269 05/19/2018   No flowsheet data found.  Discharge instruction: per After Visit Summary and "Baby and Me Booklet".  After visit meds:  Allergies as of 05/20/2018      Reactions   Cephalosporins Hives   Sulfa Antibiotics Hives      Medication List    STOP taking these medications   calcium carbonate 500 MG chewable tablet Commonly known as:  TUMS - dosed in mg elemental calcium   famotidine 10 MG tablet Commonly known as:  PEPCID     TAKE these medications   acetaminophen 500 MG tablet Commonly known as:  TYLENOL Take 500 mg by mouth every 6 (six) hours as needed for mild pain or headache.   ibuprofen 600 MG tablet Commonly known as:  ADVIL,MOTRIN Take 1 tablet (600 mg total) by mouth every 6 (six) hours.   Magnesium 500 MG Caps Take 500 mg by mouth daily.   polyethylene glycol packet Commonly known as:  MIRALAX / GLYCOLAX Take 17 g by mouth daily as needed for mild constipation.   prenatal multivitamin Tabs tablet Take 1 tablet by mouth daily at 12 noon.       Diet: routine diet  Activity: Advance as tolerated. Pelvic rest for 6 weeks.   Outpatient follow up:6 weeks Follow up Appt:No future appointments. Follow up Visit:No follow-ups on file.  Postpartum contraception: Condoms  Newborn Data: Live born female  Birth Weight: 7 lb 10.6 oz (3476 g) APGAR: 8, 9  Newborn Delivery   Birth date/time:  05/18/2018 22:16:00 Delivery type:  Vaginal, Spontaneous     Baby Feeding: Breast Disposition:home with mother   05/20/2018 Oliver Pila, MD

## 2018-05-21 ENCOUNTER — Inpatient Hospital Stay (HOSPITAL_COMMUNITY): Admission: RE | Admit: 2018-05-21 | Payer: Commercial Managed Care - PPO | Source: Ambulatory Visit

## 2019-04-24 NOTE — L&D Delivery Note (Signed)
Delivery Note Pt entered active labor and progressed quickly to complete.  She did receive her 2nd dose of PCN.  At 1:43 PM a viable female was delivered via Vaginal, Spontaneous (Presentation: Right Occiput Anterior).  APGAR: 8, 8; weight  pending.   Placenta status: Spontaneous, Intact.  Cord: 3 vessels with the following complications: None.   Anesthesia: Epidural Episiotomy: None Lacerations: 1st degree Suture Repair: 3.0 vicryl rapide Est. Blood Loss (mL): 150  Mom to postpartum.  Baby to Couplet care / Skin to Skin.  She has decided she does not want BTL for now  Zenaida Niece 11/18/2019, 1:58 PM

## 2019-04-30 LAB — OB RESULTS CONSOLE HEPATITIS B SURFACE ANTIGEN: Hepatitis B Surface Ag: NEGATIVE

## 2019-04-30 LAB — OB RESULTS CONSOLE ANTIBODY SCREEN: Antibody Screen: NEGATIVE

## 2019-04-30 LAB — OB RESULTS CONSOLE RUBELLA ANTIBODY, IGM: Rubella: IMMUNE

## 2019-04-30 LAB — OB RESULTS CONSOLE RPR: RPR: NONREACTIVE

## 2019-04-30 LAB — OB RESULTS CONSOLE GC/CHLAMYDIA
Chlamydia: NEGATIVE
Gonorrhea: NEGATIVE

## 2019-04-30 LAB — OB RESULTS CONSOLE ABO/RH: RH Type: POSITIVE

## 2019-04-30 LAB — OB RESULTS CONSOLE HIV ANTIBODY (ROUTINE TESTING): HIV: NONREACTIVE

## 2019-06-02 IMAGING — US US MFM FETAL BPP W/O NON-STRESS
1 series · 12 of 28 positions shown · non-contrast
Comparison: none

[Series 1: us mfm fetal bpp w/o non-stress · 45 acquisitions, 12 frames shown]
[im 2/45]
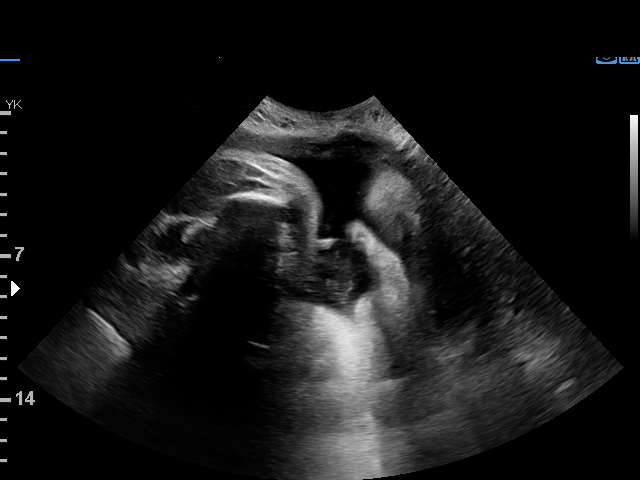
[im 5/45]
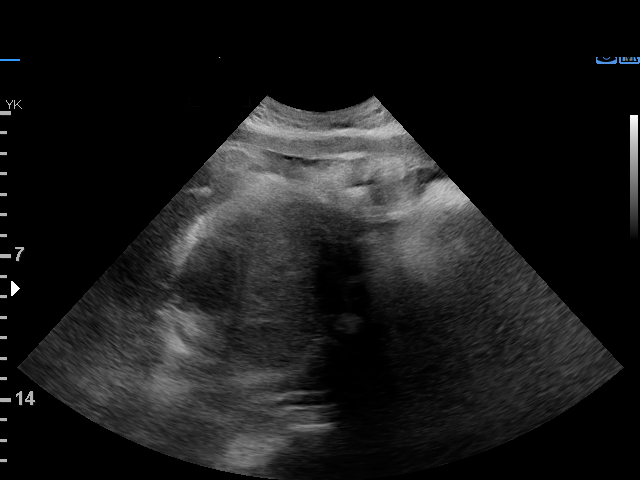
[im 9/45]
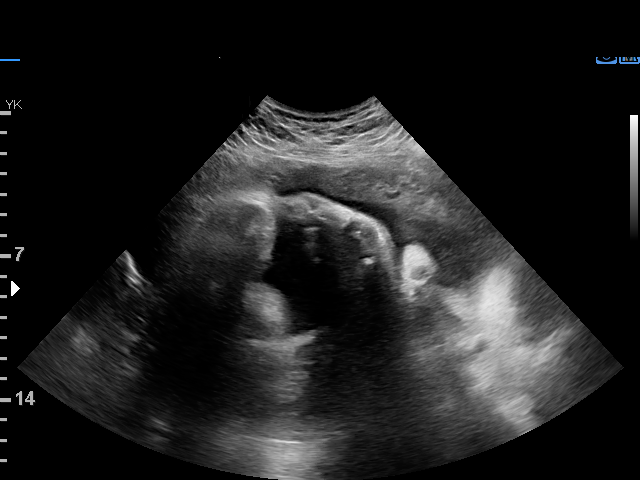
[im 14/45]
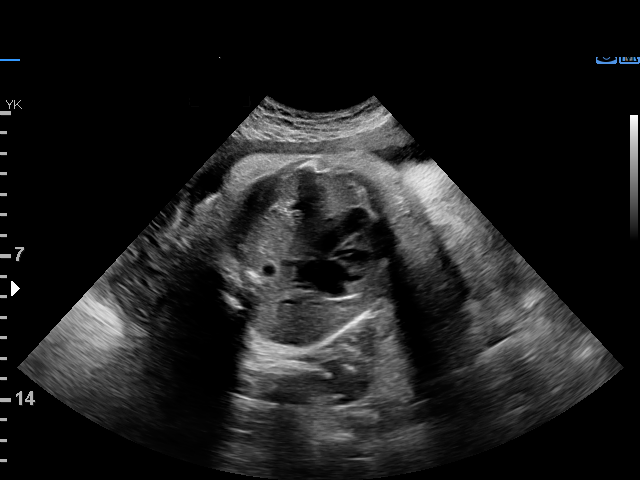
[im 17/45]
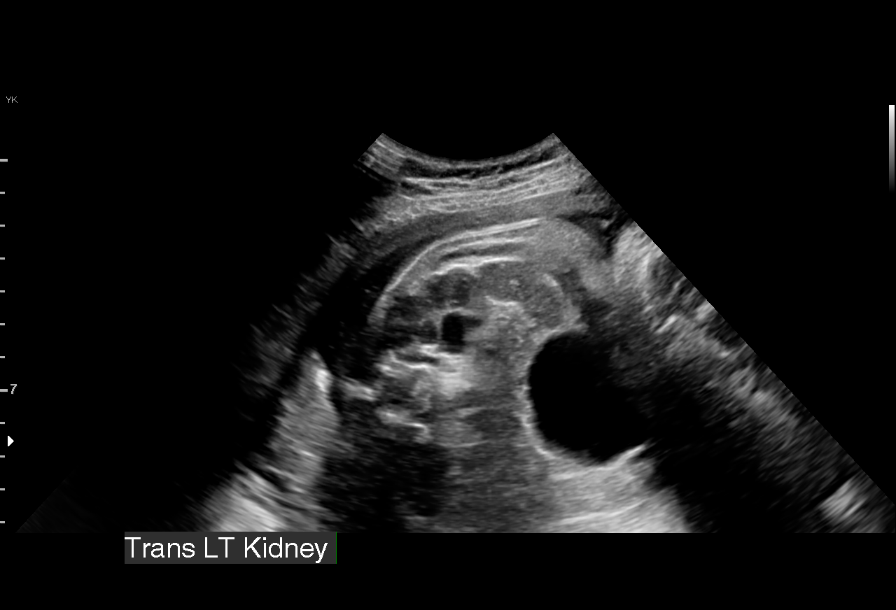
[im 20/45]
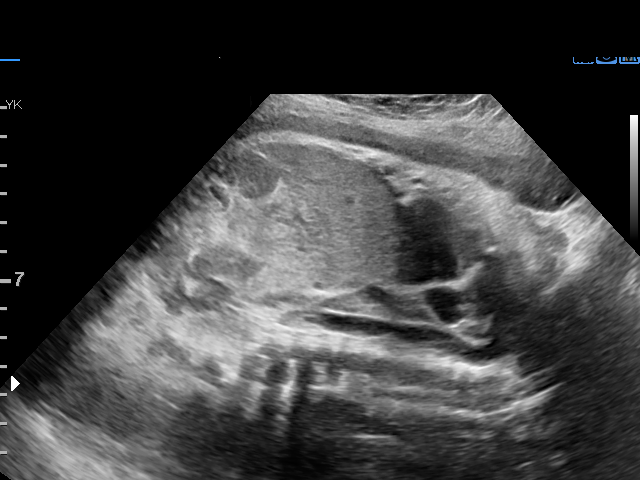
[im 25/45]
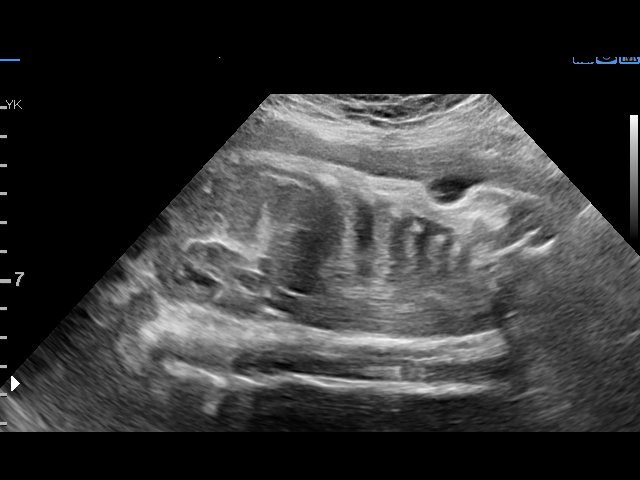
[im 28/45]
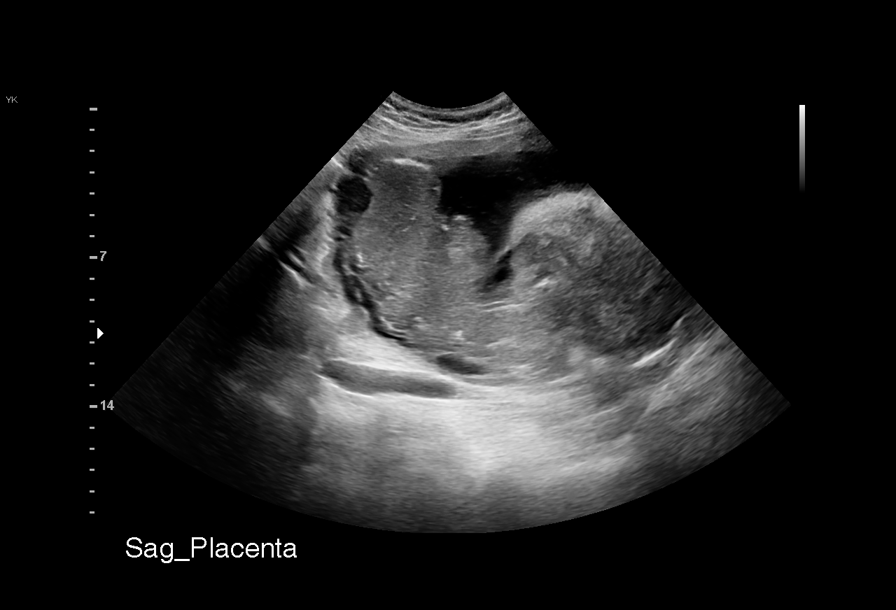
[im 31/45]
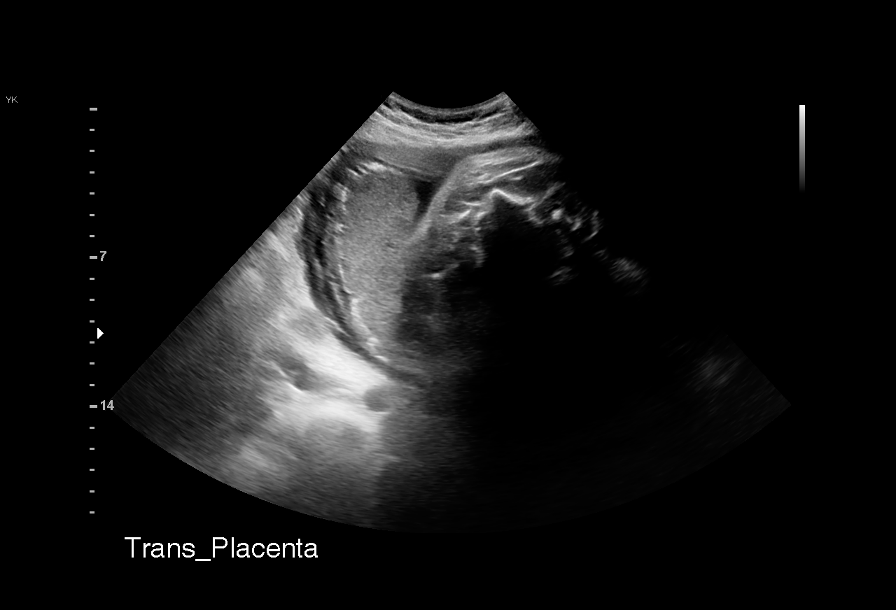
[im 36/45]
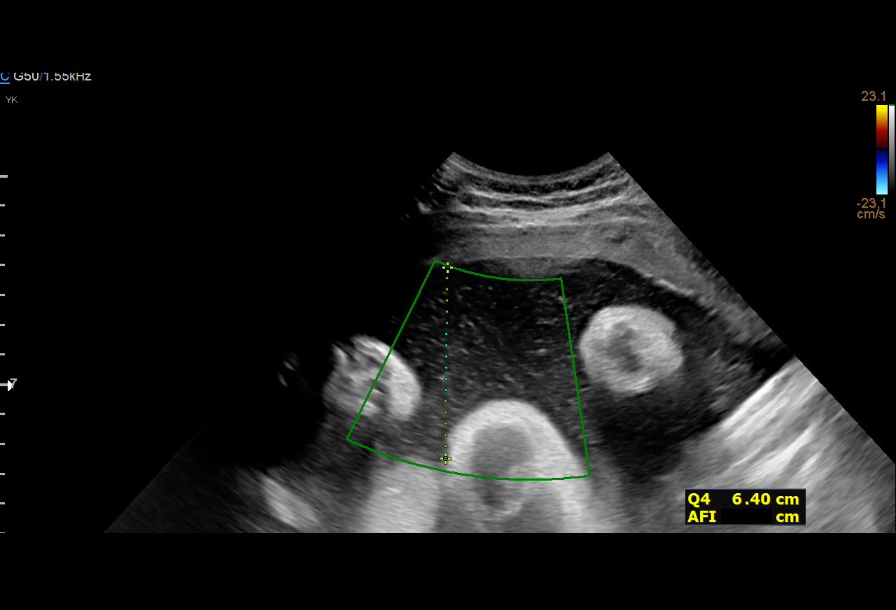
[im 40/45]
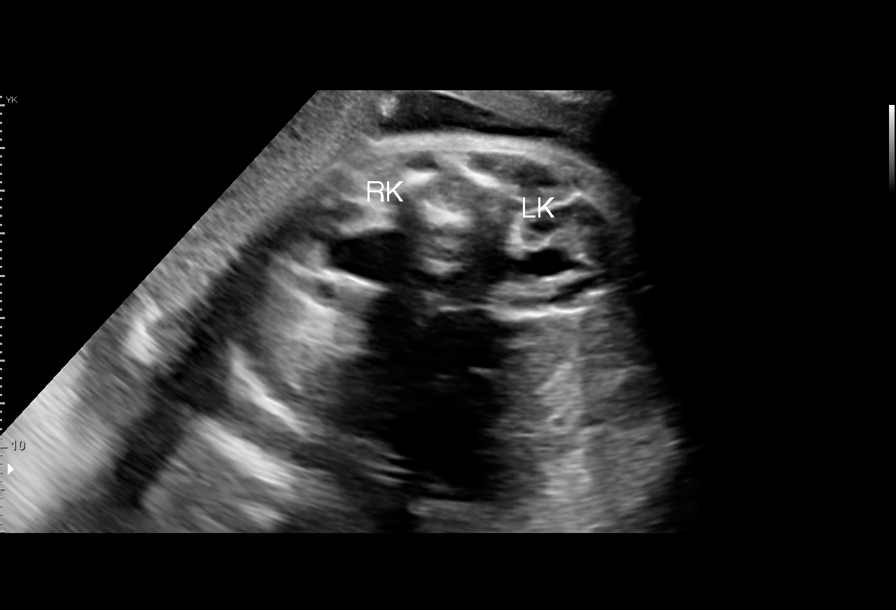
[im 43/45]
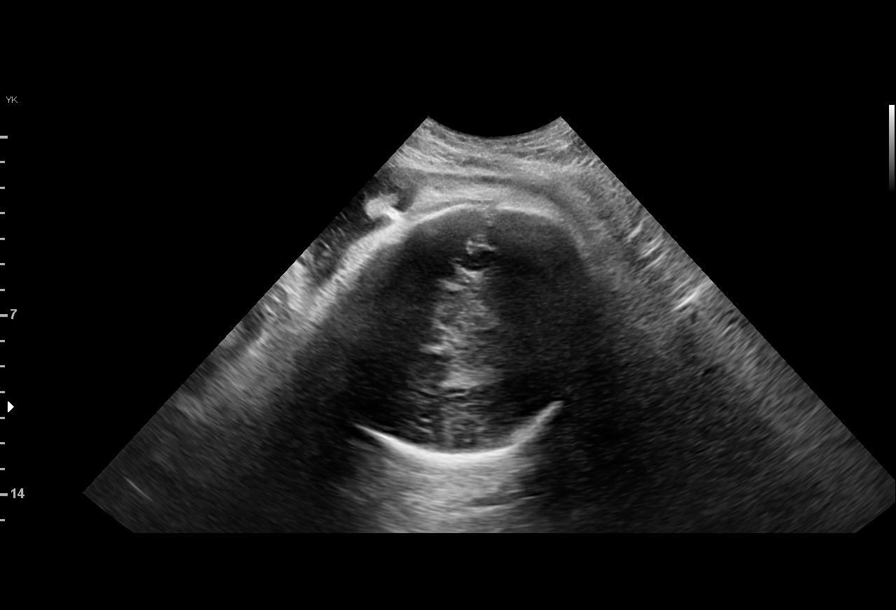

[12 of 28 positions shown; findings below may reference images not displayed]

----------------------------------------------------------------------

 ----------------------------------------------------------------------
Indications

  Decreased fetal movements, third trimester,
  unspecified
  39 weeks gestation of pregnancy
 ----------------------------------------------------------------------
Vital Signs

 BMI:
Fetal Evaluation

 Num Of Fetuses:          1
 Fetal Heart Rate(bpm):   122
 Cardiac Activity:        Observed
 Presentation:            Cephalic
 Placenta:                Posterior

 Amniotic Fluid
 AFI FV:      Within normal limits

 AFI Sum(cm)     %Tile       Largest Pocket(cm)
 16.38           68

 RUQ(cm)       RLQ(cm)       LUQ(cm)        LLQ(cm)


 Comment:    Breathing noted intermittently, but not sustained. Baby is very
             active. Amniotic fluid has echogenic particles.
Biophysical Evaluation

 Amniotic F.V:   Within normal limits       F. Tone:         Observed
 F. Movement:    Observed                   Score:           [DATE]
 F. Breathing:   Not Observed
Gestational Age

 LMP:           39w 2d        Date:  08/16/17                 EDD:   05/23/18
 Best:          39w 2d     Det. By:  LMP  (08/16/17)          EDD:   05/23/18
Anatomy

 Stomach:               Appears normal, left   Bladder:                Appears normal
                        sided
 Kidneys:               Right sided
                        pyelectasis,
                        mm

 Other:  Female gender. Lt kidney has mild pyelectasis (AP:5.8 mm.)
Impression

 Biophysical profile [DATE]
 Renal pyelectasis
Recommendations

 Consider delivery given olioghydramnios and term
 Follow up pyelectasis in the postnatal visit.

## 2019-10-29 LAB — OB RESULTS CONSOLE GBS: GBS: POSITIVE

## 2019-11-10 ENCOUNTER — Encounter (HOSPITAL_COMMUNITY): Payer: Self-pay | Admitting: *Deleted

## 2019-11-10 ENCOUNTER — Telehealth (HOSPITAL_COMMUNITY): Payer: Self-pay | Admitting: *Deleted

## 2019-11-10 NOTE — Telephone Encounter (Signed)
Preadmission screen  

## 2019-11-16 ENCOUNTER — Other Ambulatory Visit (HOSPITAL_COMMUNITY)
Admission: RE | Admit: 2019-11-16 | Discharge: 2019-11-16 | Disposition: A | Discharge status: Home / Self Care | Payer: Commercial Managed Care - PPO | Source: Ambulatory Visit | Attending: Obstetrics and Gynecology | Admitting: Obstetrics and Gynecology

## 2019-11-16 LAB — SARS CORONAVIRUS 2 (TAT 6-24 HRS): SARS Coronavirus 2: NEGATIVE

## 2019-11-17 ENCOUNTER — Other Ambulatory Visit: Payer: Self-pay | Admitting: Obstetrics and Gynecology

## 2019-11-18 ENCOUNTER — Inpatient Hospital Stay (HOSPITAL_COMMUNITY): Payer: Commercial Managed Care - PPO

## 2019-11-18 ENCOUNTER — Inpatient Hospital Stay (HOSPITAL_COMMUNITY): Payer: Commercial Managed Care - PPO | Admitting: Anesthesiology

## 2019-11-18 ENCOUNTER — Other Ambulatory Visit: Payer: Self-pay

## 2019-11-18 ENCOUNTER — Inpatient Hospital Stay (HOSPITAL_COMMUNITY)
Admission: AD | Admit: 2019-11-18 | Discharge: 2019-11-19 | DRG: 807 | Disposition: A | Discharge status: Home / Self Care | Payer: Commercial Managed Care - PPO | Attending: Obstetrics and Gynecology | Admitting: Obstetrics and Gynecology

## 2019-11-18 ENCOUNTER — Encounter (HOSPITAL_COMMUNITY): Payer: Self-pay | Admitting: Obstetrics and Gynecology

## 2019-11-18 DIAGNOSIS — O99824 Streptococcus B carrier state complicating childbirth: Principal | ICD-10-CM | POA: Diagnosis present

## 2019-11-18 DIAGNOSIS — Z20822 Contact with and (suspected) exposure to covid-19: Secondary | ICD-10-CM | POA: Diagnosis present

## 2019-11-18 DIAGNOSIS — Z3A39 39 weeks gestation of pregnancy: Secondary | ICD-10-CM

## 2019-11-18 DIAGNOSIS — O99214 Obesity complicating childbirth: Secondary | ICD-10-CM | POA: Diagnosis present

## 2019-11-18 DIAGNOSIS — O26893 Other specified pregnancy related conditions, third trimester: Secondary | ICD-10-CM | POA: Diagnosis present

## 2019-11-18 LAB — CBC
HCT: 39 % (ref 36.0–46.0)
Hemoglobin: 12.5 g/dL (ref 12.0–15.0)
MCH: 28.1 pg (ref 26.0–34.0)
MCHC: 32.1 g/dL (ref 30.0–36.0)
MCV: 87.6 fL (ref 80.0–100.0)
Platelets: 293 10*3/uL (ref 150–400)
RBC: 4.45 MIL/uL (ref 3.87–5.11)
RDW: 14.1 % (ref 11.5–15.5)
WBC: 9.1 10*3/uL (ref 4.0–10.5)
nRBC: 0 % (ref 0.0–0.2)

## 2019-11-18 LAB — TYPE AND SCREEN
ABO/RH(D): A POS
Antibody Screen: NEGATIVE

## 2019-11-18 MED ORDER — SOD CITRATE-CITRIC ACID 500-334 MG/5ML PO SOLN: 30.0000 mL | ORAL | Status: DC | PRN

## 2019-11-18 MED ORDER — DIPHENHYDRAMINE HCL 50 MG/ML IJ SOLN: 12.5000 mg | INTRAMUSCULAR | Status: DC | PRN

## 2019-11-18 MED ORDER — FENTANYL-BUPIVACAINE-NACL 0.5-0.125-0.9 MG/250ML-% EP SOLN
12.0000 mL/h | EPIDURAL | Status: DC | PRN
  Filled 2019-11-18: qty 250

## 2019-11-18 MED ORDER — COCONUT OIL OIL: 1.0000 "application " | TOPICAL_OIL | Status: DC | PRN

## 2019-11-18 MED ORDER — LIDOCAINE HCL (PF) 1 % IJ SOLN: 30.0000 mL | INTRAMUSCULAR | Status: DC | PRN

## 2019-11-18 MED ORDER — OXYCODONE HCL 5 MG PO TABS: 5.0000 mg | ORAL_TABLET | ORAL | Status: DC | PRN

## 2019-11-18 MED ORDER — DIPHENHYDRAMINE HCL 25 MG PO CAPS: 25.0000 mg | ORAL_CAPSULE | Freq: Four times a day (QID) | ORAL | Status: DC | PRN

## 2019-11-18 MED ORDER — TERBUTALINE SULFATE 1 MG/ML IJ SOLN: 0.2500 mg | Freq: Once | INTRAMUSCULAR | Status: DC | PRN

## 2019-11-18 MED ORDER — OXYCODONE-ACETAMINOPHEN 5-325 MG PO TABS: 2.0000 | ORAL_TABLET | ORAL | Status: DC | PRN

## 2019-11-18 MED ORDER — DIBUCAINE (PERIANAL) 1 % EX OINT: 1.0000 "application " | TOPICAL_OINTMENT | CUTANEOUS | Status: DC | PRN

## 2019-11-18 MED ORDER — ZOLPIDEM TARTRATE 5 MG PO TABS: 5.0000 mg | ORAL_TABLET | Freq: Every evening | ORAL | Status: DC | PRN

## 2019-11-18 MED ORDER — ONDANSETRON HCL 4 MG/2ML IJ SOLN: 4.0000 mg | Freq: Four times a day (QID) | INTRAMUSCULAR | Status: DC | PRN

## 2019-11-18 MED ORDER — OXYCODONE-ACETAMINOPHEN 5-325 MG PO TABS: 1.0000 | ORAL_TABLET | ORAL | Status: DC | PRN

## 2019-11-18 MED ORDER — LIDOCAINE HCL (PF) 1 % IJ SOLN
INTRAMUSCULAR | Status: DC | PRN
  Administered 2019-11-18 (×2): 4 mL via EPIDURAL

## 2019-11-18 MED ORDER — ACETAMINOPHEN 325 MG PO TABS: 650.0000 mg | ORAL_TABLET | ORAL | Status: DC | PRN

## 2019-11-18 MED ORDER — LACTATED RINGERS IV SOLN
500.0000 mL | Freq: Once | INTRAVENOUS | Status: AC
  Administered 2019-11-18: 500 mL via INTRAVENOUS

## 2019-11-18 MED ORDER — BENZOCAINE-MENTHOL 20-0.5 % EX AERO
1.0000 "application " | INHALATION_SPRAY | CUTANEOUS | Status: DC | PRN
  Administered 2019-11-18: 1 via TOPICAL
  Filled 2019-11-18: qty 56

## 2019-11-18 MED ORDER — BUTORPHANOL TARTRATE 1 MG/ML IJ SOLN: 1.0000 mg | INTRAMUSCULAR | Status: DC | PRN

## 2019-11-18 MED ORDER — TETANUS-DIPHTH-ACELL PERTUSSIS 5-2.5-18.5 LF-MCG/0.5 IM SUSP: 0.5000 mL | Freq: Once | INTRAMUSCULAR | Status: DC

## 2019-11-18 MED ORDER — OXYCODONE HCL 5 MG PO TABS: 10.0000 mg | ORAL_TABLET | ORAL | Status: DC | PRN

## 2019-11-18 MED ORDER — PRENATAL MULTIVITAMIN CH
1.0000 | ORAL_TABLET | Freq: Every day | ORAL | Status: DC
  Administered 2019-11-19: 1 via ORAL
  Filled 2019-11-18: qty 1

## 2019-11-18 MED ORDER — WITCH HAZEL-GLYCERIN EX PADS: 1.0000 "application " | MEDICATED_PAD | CUTANEOUS | Status: DC | PRN

## 2019-11-18 MED ORDER — ONDANSETRON HCL 4 MG PO TABS: 4.0000 mg | ORAL_TABLET | ORAL | Status: DC | PRN

## 2019-11-18 MED ORDER — OXYTOCIN-SODIUM CHLORIDE 30-0.9 UT/500ML-% IV SOLN: 2.5000 [IU]/h | INTRAVENOUS | Status: DC

## 2019-11-18 MED ORDER — METHYLERGONOVINE MALEATE 0.2 MG PO TABS: 0.2000 mg | ORAL_TABLET | ORAL | Status: DC | PRN

## 2019-11-18 MED ORDER — SENNOSIDES-DOCUSATE SODIUM 8.6-50 MG PO TABS
2.0000 | ORAL_TABLET | ORAL | Status: DC
  Administered 2019-11-18: 2 via ORAL
  Filled 2019-11-18: qty 2

## 2019-11-18 MED ORDER — LACTATED RINGERS IV SOLN: 500.0000 mL | INTRAVENOUS | Status: DC | PRN

## 2019-11-18 MED ORDER — EPHEDRINE 5 MG/ML INJ: 10.0000 mg | INTRAVENOUS | Status: DC | PRN

## 2019-11-18 MED ORDER — MAGNESIUM HYDROXIDE 400 MG/5ML PO SUSP: 30.0000 mL | ORAL | Status: DC | PRN

## 2019-11-18 MED ORDER — ACETAMINOPHEN 325 MG PO TABS
650.0000 mg | ORAL_TABLET | ORAL | Status: DC | PRN
  Administered 2019-11-18 – 2019-11-19 (×3): 650 mg via ORAL
  Filled 2019-11-18 (×3): qty 2

## 2019-11-18 MED ORDER — OXYTOCIN-SODIUM CHLORIDE 30-0.9 UT/500ML-% IV SOLN
1.0000 m[IU]/min | INTRAVENOUS | Status: DC
  Administered 2019-11-18: 2 m[IU]/min via INTRAVENOUS
  Filled 2019-11-18: qty 500

## 2019-11-18 MED ORDER — IBUPROFEN 600 MG PO TABS
600.0000 mg | ORAL_TABLET | Freq: Four times a day (QID) | ORAL | Status: DC
  Administered 2019-11-18 – 2019-11-19 (×4): 600 mg via ORAL
  Filled 2019-11-18 (×4): qty 1

## 2019-11-18 MED ORDER — PHENYLEPHRINE 40 MCG/ML (10ML) SYRINGE FOR IV PUSH (FOR BLOOD PRESSURE SUPPORT)
80.0000 ug | PREFILLED_SYRINGE | INTRAVENOUS | Status: DC | PRN
  Filled 2019-11-18: qty 10

## 2019-11-18 MED ORDER — LACTATED RINGERS IV SOLN: 500.0000 mL | Freq: Once | INTRAVENOUS | Status: DC

## 2019-11-18 MED ORDER — OXYTOCIN BOLUS FROM INFUSION
333.0000 mL | Freq: Once | INTRAVENOUS | Status: AC
  Administered 2019-11-18: 333 mL via INTRAVENOUS

## 2019-11-18 MED ORDER — PENICILLIN G POT IN DEXTROSE 60000 UNIT/ML IV SOLN
3.0000 10*6.[IU] | INTRAVENOUS | Status: DC
  Administered 2019-11-18: 3 10*6.[IU] via INTRAVENOUS
  Filled 2019-11-18: qty 50

## 2019-11-18 MED ORDER — MEASLES, MUMPS & RUBELLA VAC IJ SOLR: 0.5000 mL | Freq: Once | INTRAMUSCULAR | Status: DC

## 2019-11-18 MED ORDER — METHYLERGONOVINE MALEATE 0.2 MG/ML IJ SOLN: 0.2000 mg | INTRAMUSCULAR | Status: DC | PRN

## 2019-11-18 MED ORDER — LACTATED RINGERS IV SOLN: INTRAVENOUS | Status: DC

## 2019-11-18 MED ORDER — PHENYLEPHRINE 40 MCG/ML (10ML) SYRINGE FOR IV PUSH (FOR BLOOD PRESSURE SUPPORT): 80.0000 ug | PREFILLED_SYRINGE | INTRAVENOUS | Status: DC | PRN

## 2019-11-18 MED ORDER — SIMETHICONE 80 MG PO CHEW: 80.0000 mg | CHEWABLE_TABLET | ORAL | Status: DC | PRN

## 2019-11-18 MED ORDER — SODIUM CHLORIDE 0.9 % IV SOLN
5.0000 10*6.[IU] | Freq: Once | INTRAVENOUS | Status: AC
  Administered 2019-11-18: 5 10*6.[IU] via INTRAVENOUS
  Filled 2019-11-18: qty 5

## 2019-11-18 MED ORDER — SODIUM CHLORIDE (PF) 0.9 % IJ SOLN
INTRAMUSCULAR | Status: DC | PRN
  Administered 2019-11-18: 12 mL/h via EPIDURAL

## 2019-11-18 MED ORDER — ONDANSETRON HCL 4 MG/2ML IJ SOLN: 4.0000 mg | INTRAMUSCULAR | Status: DC | PRN

## 2019-11-18 NOTE — Anesthesia Preprocedure Evaluation (Signed)
Anesthesia Evaluation  Patient identified by MRN, date of birth, ID band Patient awake    Reviewed: Allergy & Precautions, Patient's Chart, lab work & pertinent test results  History of Anesthesia Complications Negative for: history of anesthetic complications  Airway Mallampati: II  TM Distance: >3 FB Neck ROM: Full    Dental no notable dental hx.    Pulmonary neg pulmonary ROS,    Pulmonary exam normal        Cardiovascular negative cardio ROS Normal cardiovascular exam     Neuro/Psych negative neurological ROS  negative psych ROS   GI/Hepatic negative GI ROS, Neg liver ROS,   Endo/Other  Morbid obesity  Renal/GU negative Renal ROS  negative genitourinary   Musculoskeletal negative musculoskeletal ROS (+)   Abdominal   Peds  Hematology negative hematology ROS (+)   Anesthesia Other Findings Day of surgery medications reviewed with patient.  Reproductive/Obstetrics (+) Pregnancy                             Anesthesia Physical Anesthesia Plan  ASA: III  Anesthesia Plan: Epidural   Post-op Pain Management:    Induction:   PONV Risk Score and Plan: Treatment may vary due to age or medical condition  Airway Management Planned: Natural Airway  Additional Equipment:   Intra-op Plan:   Post-operative Plan:   Informed Consent: I have reviewed the patients History and Physical, chart, labs and discussed the procedure including the risks, benefits and alternatives for the proposed anesthesia with the patient or authorized representative who has indicated his/her understanding and acceptance.       Plan Discussed with:   Anesthesia Plan Comments:         Anesthesia Quick Evaluation  

## 2019-11-18 NOTE — Anesthesia Procedure Notes (Signed)
Epidural Patient location during procedure: OB Start time: 11/18/2019 10:53 AM End time: 11/18/2019 10:56 AM  Staffing Anesthesiologist: Kaylyn Layer, MD Performed: anesthesiologist   Preanesthetic Checklist Completed: patient identified, IV checked, risks and benefits discussed, monitors and equipment checked, pre-op evaluation and timeout performed  Epidural Patient position: sitting Prep: DuraPrep and site prepped and draped Patient monitoring: continuous pulse ox, blood pressure and heart rate Approach: midline Location: L3-L4 Injection technique: LOR air  Needle:  Needle type: Tuohy  Needle gauge: 17 G Needle length: 9 cm Needle insertion depth: 5 cm Catheter type: closed end flexible Catheter size: 19 Gauge Catheter at skin depth: 10 cm Test dose: negative and Other (1% lidocaine)  Assessment Events: blood not aspirated, injection not painful, no injection resistance, no paresthesia and negative IV test  Additional Notes Patient identified. Risks, benefits, and alternatives discussed with patient including but not limited to bleeding, infection, nerve damage, paralysis, failed block, incomplete pain control, headache, blood pressure changes, nausea, vomiting, reactions to medication, itching, and postpartum back pain. Confirmed with bedside nurse the patient's most recent platelet count. Confirmed with patient that they are not currently taking any anticoagulation, have any bleeding history, or any family history of bleeding disorders. Patient expressed understanding and wished to proceed. All questions were answered. Sterile technique was used throughout the entire procedure. Please see nursing notes for vital signs.   Crisp LOR on first pass. Test dose was given through epidural catheter and negative prior to continuing to dose epidural or start infusion. Warning signs of high block given to the patient including shortness of breath, tingling/numbness in hands, complete  motor block, or any concerning symptoms with instructions to call for help. Patient was given instructions on fall risk and not to get out of bed. All questions and concerns addressed with instructions to call with any issues or inadequate analgesia.  Reason for block:procedure for pain

## 2019-11-18 NOTE — H&P (Signed)
Marilyn Guerra is a 29 y.o. female, G3 P2002, EGA [redacted] weeks with EDC 8-2 presenting for elective induction.  Prenatal care essentially uncomplicated.  OB History    Gravida  3   Para  2   Term  2   Preterm      AB      Living  2     SAB      TAB      Ectopic      Multiple  0   Live Births  2          Past Medical History:  Diagnosis Date  . Indication for care in labor or delivery 05/18/2018  . Medical history non-contributory   . SVD (spontaneous vaginal delivery) 05/18/2018   Past Surgical History:  Procedure Laterality Date  . NO PAST SURGERIES    . WISDOM TOOTH EXTRACTION     Family History: family history is not on file. Social History:  reports that she has never smoked. She has never used smokeless tobacco. She reports that she does not drink alcohol and does not use drugs.     Maternal Diabetes: No Genetic Screening: Declined Maternal Ultrasounds/Referrals: Normal Fetal Ultrasounds or other Referrals:  None Maternal Substance Abuse:  No Significant Maternal Medications:  None Significant Maternal Lab Results:  Group B Strep positive Other Comments:  None  Review of Systems  Respiratory: Negative.   Cardiovascular: Negative.    Maternal Medical History:  Fetal activity: Perceived fetal activity is normal.    Prenatal complications: no prenatal complications Prenatal Complications - Diabetes: none.      Weight (!) 110.3 kg, unknown if currently breastfeeding. Maternal Exam:  Abdomen: Patient reports no abdominal tenderness. Estimated fetal weight is 8 lbs.   Fetal presentation: vertex  Introitus: Normal vulva. Normal vagina.  Amniotic fluid character: not assessed.  Pelvis: adequate for delivery.      Fetal Exam Fetal Monitor Review: Mode: ultrasound.   Baseline rate: 140.      Physical Exam Vitals reviewed.  Constitutional:      Appearance: Normal appearance.  Cardiovascular:     Rate and Rhythm: Normal rate and regular  rhythm.  Pulmonary:     Effort: Pulmonary effort is normal. No respiratory distress.  Abdominal:     Palpations: Abdomen is soft.  Genitourinary:    General: Normal vulva.  Neurological:     Mental Status: She is alert.     Prenatal labs: ABO, Rh: A/Positive/-- (01/07 0000) Antibody: Negative (01/07 0000) Rubella: Immune (01/07 0000) RPR: Nonreactive (01/07 0000)  HBsAg: Negative (01/07 0000)  HIV: Non-reactive (01/07 0000)  GBS: Positive/-- (07/08 0000)   Assessment/Plan: IUP at 39 weeks, +GBS, for elective induction.  Will start pitocin and PCN, monitor p[rogress, anticipate SVD   Marilyn Guerra 11/18/2019, 9:02 AM

## 2019-11-18 NOTE — Progress Notes (Signed)
Comfortable with epidural Afeb, VSS FHT-120-130, mod variability, + accels, occasional varaible decel, Cat II, ctx q 3-4 min VE3-4/60/-2, vtx, AROM clear Continue pitocin and PCN, monitor progress, anticipate SVD

## 2019-11-18 NOTE — Lactation Note (Signed)
This note was copied from a baby's chart. Lactation Consultation Note  Patient Name: Girl Sherrilyn Nairn [id number] Date: 11/18/2019   Mom is a P3.  Baby girl Madeline asleep in dads arms.  She is now 5 hours old. Mom reports she breastfed with her first baby for 2 and a half months and her second baby for 6 months. Mom reports no challenges with breastfeeding except all of her babies have had a tight upper lip at first. Discussed with mom that is usually addressed with breastfeeding when mom has severe pain on areola or problems with milk spillage.  Urged to vary breastfeeding positions and check nipples/areola past breastfeeding. Urged hand expression/run=b expressed mothers milk on nipples and air dry. Reviewed and left [facility] Consultation breastfeeding handout.  Urged to call lactation as needed.     Feeding Feeding Type: Breast Milk  LATCH Score Latch: Repeated attempts needed to sustain latch, nipple held in mouth throughout feeding, stimulation needed to elicit sucking reflex.  Audible Swallowing: A few with stimulation  Type of Nipple: Everted at rest and after stimulation  Comfort (Breast/Nipple): Soft / non-tender  Hold (Positioning): No assistance needed to correctly position infant at breast.  LATCH Score: 8  Interventions    Lactation Tools Discussed/Used     Consult Status      Shahan Starks Michaelle Copas 11/18/2019, 7:06 PM

## 2019-11-19 LAB — RPR: RPR Ser Ql: NONREACTIVE

## 2019-11-19 MED ORDER — PRENATAL MULTIVITAMIN CH: 1.0000 | ORAL_TABLET | Freq: Every day | ORAL | 3 refills | Status: AC

## 2019-11-19 MED ORDER — IBUPROFEN 600 MG PO TABS: 600.0000 mg | ORAL_TABLET | Freq: Four times a day (QID) | ORAL | 1 refills | Status: AC | PRN

## 2019-11-19 NOTE — Anesthesia Postprocedure Evaluation (Signed)
Anesthesia Post Note  Patient: Marilyn Guerra  Procedure(s) Performed: AN AD HOC LABOR EPIDURAL     Patient location during evaluation: Mother Baby Anesthesia Type: Epidural Level of consciousness: awake, awake and alert and oriented Pain management: pain level controlled Vital Signs Assessment: post-procedure vital signs reviewed and stable Respiratory status: spontaneous breathing, nonlabored ventilation and respiratory function stable Cardiovascular status: stable Postop Assessment: patient able to bend at knees, no apparent nausea or vomiting, adequate PO intake and able to ambulate Anesthetic complications: no   No complications documented.  Last Vitals:  Vitals:   11/19/19 0159 11/19/19 0527  BP: (!) 134/81 (!) 126/88  Pulse: 71 79  Resp: 16 18  Temp: 36.8 C 36.6 C  SpO2: 97% 97%    Last Pain:  Vitals:   11/19/19 0529  TempSrc:   PainSc: 1    Pain Goal:                   [name]

## 2019-11-19 NOTE — Lactation Note (Signed)
This note was copied from a baby's chart. Lactation Consultation Note  Patient Name: Girl Maeola Mchaney [id number] Date: 11/19/2019 Reason for consult: Follow-up assessment;Term  LC in to visit with P3 Mom of term baby at 21 hrs old.  Baby at 3.5 % weight loss.  Baby has breastfed 10 times so far, and states baby latches well and denies discomfort other than uterine cramping with breastfeeding.   Encouraged STS and offer breast with cues.  Engorgement prevention and treatment reviewed.   Mom aware of OP lactation support available to her and encouraged to call prn.    Consult Status Consult Status: Complete Date: 11/19/19 Follow-up type: Call as needed    Judee Clara 11/19/2019, 11:12 AM

## 2019-11-19 NOTE — Progress Notes (Signed)
Post Partum Day 1 Subjective: no complaints, up ad lib, voiding, tolerating PO and nl lochia, pain controlled  Objective: Blood pressure (!) 126/88, pulse 79, temperature 97.9 F (36.6 C), temperature source Oral, resp. rate 18, height 5\' 4"  (1.626 m), weight (!) 110.5 kg, SpO2 97 %, unknown if currently breastfeeding.  Physical Exam:  General: alert and no distress Lochia: appropriate Uterine Fundus: firm   Recent Labs    11/18/19 0903  HGB 12.5  HCT 39.0    Assessment/Plan: Plan for discharge tomorrow, Breastfeeding and Lactation consult.  Routine PP care.    Pt desires d/c to home if baby is cleared by pediatrics.  Will d/c with Motrin and PNV.  F/u 6 wks.     LOS: 1 day   Marilyn Guerra 11/19/2019, 8:40 AM

## 2019-11-19 NOTE — Discharge Summary (Signed)
Postpartum Discharge Summary     Patient Name: Marilyn Guerra DOB: 06/01/1990 MRN: [id number]  Date of admission: 11/18/2019 Delivery date:11/18/2019  Delivering provider: Willis Modena, TODD  Date of discharge: 11/19/2019  Admitting diagnosis: Indication for care in labor or delivery [O75.9] Intrauterine pregnancy: 107w2d    Secondary diagnosis:  Active Problems:   Indication for care in labor or delivery  Additional problems: +GBS    Discharge diagnosis: Term Pregnancy Delivered                                              Post partum procedures:N/A Augmentation: AROM and Pitocin Complications: None  Hospital course: Induction of Labor With Vaginal Delivery   29y.o. yo [id number] at 320w2das admitted to the hospital 11/18/2019 for induction of labor.  Indication for induction: Favorable cervix at term.  Patient had an uncomplicated labor course as follows: Membrane Rupture Time/Date: 12:08 PM ,11/18/2019   Delivery Method:Vaginal, Spontaneous  Episiotomy: None  Lacerations:  1st degree  Details of delivery can be found in separate delivery note.  Patient had a routine postpartum course. Patient is discharged home 11/19/19.  Newborn Data: Birth date:11/18/2019  Birth time:1:43 PM  Gender:Female  Living status:Living  Apgars:8 ,9  Weight:3620 g   Magnesium Sulfate received: No BMZ received: No Rhophylac:No MMR:No T-DaP:Given prenatally Flu: No Transfusion:No  Physical exam  Vitals:   11/18/19 1700 11/18/19 2140 11/19/19 0159 11/19/19 0527  BP: 127/81 (!) 131/89 (!) 134/81 (!) 126/88  Pulse: 91 80 71 79  Resp: _0 Temp:  98.1 F (36.7 C) 98.3 F (36.8 C) 97.9 F (36.6 C)  TempSrc:  Oral Oral Oral  SpO2: 99% 97% 97% 97%  Weight:      Height:       General: alert and no distress Lochia: appropriate Uterine Fundus: firm  Labs: Lab Results  Component Value Date   WBC 9.1 11/18/2019   HGB 12.5 11/18/2019   HCT 39.0 11/18/2019   MCV 87.6 11/18/2019    PLT 293 11/18/2019   No flowsheet data found. Edinburgh Score: Edinburgh Postnatal Depression Scale Screening Tool 11/18/2019  I have been able to laugh and see the funny side of things. 0  I have looked forward with enjoyment to things. 0  I have blamed myself unnecessarily when things went wrong. 1  I have been anxious or worried for no good reason. 0  I have felt scared or panicky for no good reason. 0  Things have been getting on top of me. 1  I have been so unhappy that I have had difficulty sleeping. 0  I have felt sad or miserable. 1  I have been so unhappy that I have been crying. 1  The thought of harming myself has occurred to me. 0  Edinburgh Postnatal Depression Scale Total 4      After visit meds:  Allergies as of 11/19/2019      Reactions   Cephalosporins Hives   Sulfa Antibiotics Hives      Medication List    TAKE these medications   acetaminophen 500 MG tablet Commonly known as: TYLENOL Take 500 mg by mouth every 6 (six) hours as needed for mild pain or headache.   famotidine 20 MG tablet Commonly known as: PEPCID Take 20 mg by mouth 2 (two) times daily as needed  for heartburn or indigestion.   ibuprofen 600 MG tablet Commonly known as: ADVIL Take 1 tablet (600 mg total) by mouth every 6 (six) hours as needed. What changed:   when to take this  reasons to take this   prenatal multivitamin Tabs tablet Take 1 tablet by mouth daily at 12 noon.        Discharge home in stable condition Infant Feeding: Breast Infant Disposition:home with mother Discharge instruction: per After Visit Summary and Postpartum booklet. Activity: Advance as tolerated. Pelvic rest for 6 weeks.  Diet: routine diet Anticipated Birth Control: Unsure Postpartum Appointment:6 weeks Additional Postpartum F/U: N/A Future Appointments:No future appointments. Follow up Visit:  Follow-up Information    Meisinger, Todd, MD. Schedule an appointment as soon as possible for a  visit in 6 week(s).   Specialty: Obstetrics and Gynecology Why: for postpartum check Contact information: [address]  [place] [address] [phone]                   11/19/2019 Janyth Contes, MD

## 2023-01-21 ENCOUNTER — Other Ambulatory Visit: Payer: Self-pay
